# Patient Record
Sex: Male | Born: 1940 | Race: White | Hispanic: No | Smoking: Former smoker
Health system: Southern US, Community
[De-identification: ages and names within clinical notes are randomized; demographics above are authoritative.]

## PROBLEM LIST (undated history)

## (undated) DIAGNOSIS — C189 Malignant neoplasm of colon, unspecified: Secondary | ICD-10-CM

## (undated) DIAGNOSIS — I10 Essential (primary) hypertension: Secondary | ICD-10-CM

## (undated) DIAGNOSIS — E785 Hyperlipidemia, unspecified: Secondary | ICD-10-CM

## (undated) DIAGNOSIS — T7840XA Allergy, unspecified, initial encounter: Secondary | ICD-10-CM

## (undated) DIAGNOSIS — D649 Anemia, unspecified: Secondary | ICD-10-CM

## (undated) DIAGNOSIS — E669 Obesity, unspecified: Secondary | ICD-10-CM

## (undated) HISTORY — PX: OTHER SURGICAL HISTORY: SHX169

## (undated) HISTORY — DX: Allergy, unspecified, initial encounter: T78.40XA

---

## 2007-11-20 ENCOUNTER — Other Ambulatory Visit: Payer: Self-pay

## 2007-11-20 ENCOUNTER — Inpatient Hospital Stay: Payer: Self-pay | Admitting: Surgery

## 2007-11-23 ENCOUNTER — Ambulatory Visit: Payer: Self-pay | Admitting: Internal Medicine

## 2007-12-08 ENCOUNTER — Ambulatory Visit: Payer: Self-pay | Admitting: Family

## 2007-12-15 ENCOUNTER — Ambulatory Visit: Payer: Self-pay | Admitting: Internal Medicine

## 2007-12-22 ENCOUNTER — Ambulatory Visit: Payer: Self-pay | Admitting: Internal Medicine

## 2007-12-23 ENCOUNTER — Ambulatory Visit: Payer: Self-pay | Admitting: Internal Medicine

## 2007-12-29 ENCOUNTER — Ambulatory Visit: Payer: Self-pay | Admitting: Surgery

## 2007-12-30 ENCOUNTER — Ambulatory Visit: Payer: Self-pay | Admitting: Surgery

## 2008-01-03 ENCOUNTER — Ambulatory Visit: Payer: Self-pay | Admitting: Family

## 2008-01-10 ENCOUNTER — Ambulatory Visit: Payer: Self-pay | Admitting: Internal Medicine

## 2008-01-23 ENCOUNTER — Ambulatory Visit: Payer: Self-pay | Admitting: Internal Medicine

## 2008-02-22 ENCOUNTER — Ambulatory Visit: Payer: Self-pay | Admitting: Internal Medicine

## 2008-03-24 ENCOUNTER — Ambulatory Visit: Payer: Self-pay | Admitting: Internal Medicine

## 2008-04-24 ENCOUNTER — Ambulatory Visit: Payer: Self-pay | Admitting: Internal Medicine

## 2008-05-24 ENCOUNTER — Ambulatory Visit: Payer: Self-pay | Admitting: Internal Medicine

## 2008-06-24 ENCOUNTER — Ambulatory Visit: Payer: Self-pay | Admitting: Internal Medicine

## 2008-07-24 ENCOUNTER — Ambulatory Visit: Payer: Self-pay | Admitting: Internal Medicine

## 2008-08-24 ENCOUNTER — Ambulatory Visit: Payer: Self-pay | Admitting: Internal Medicine

## 2008-09-24 ENCOUNTER — Ambulatory Visit: Payer: Self-pay | Admitting: Internal Medicine

## 2008-10-22 ENCOUNTER — Ambulatory Visit: Payer: Self-pay | Admitting: Internal Medicine

## 2008-11-22 ENCOUNTER — Ambulatory Visit: Payer: Self-pay | Admitting: Internal Medicine

## 2008-11-29 ENCOUNTER — Ambulatory Visit: Payer: Self-pay | Admitting: Internal Medicine

## 2008-12-22 ENCOUNTER — Ambulatory Visit: Payer: Self-pay | Admitting: Internal Medicine

## 2009-01-22 ENCOUNTER — Ambulatory Visit: Payer: Self-pay | Admitting: Internal Medicine

## 2009-02-21 ENCOUNTER — Ambulatory Visit: Payer: Self-pay | Admitting: Internal Medicine

## 2009-03-08 ENCOUNTER — Ambulatory Visit: Payer: Self-pay | Admitting: Internal Medicine

## 2009-03-24 ENCOUNTER — Ambulatory Visit: Payer: Self-pay | Admitting: Internal Medicine

## 2009-06-24 ENCOUNTER — Ambulatory Visit: Payer: Self-pay | Admitting: Internal Medicine

## 2009-06-28 ENCOUNTER — Ambulatory Visit: Payer: Self-pay | Admitting: Internal Medicine

## 2009-07-24 ENCOUNTER — Ambulatory Visit: Payer: Self-pay | Admitting: Internal Medicine

## 2009-10-22 ENCOUNTER — Ambulatory Visit: Payer: Self-pay | Admitting: Internal Medicine

## 2009-10-25 ENCOUNTER — Ambulatory Visit: Payer: Self-pay | Admitting: Internal Medicine

## 2009-11-22 ENCOUNTER — Ambulatory Visit: Payer: Self-pay | Admitting: Internal Medicine

## 2009-12-22 ENCOUNTER — Ambulatory Visit: Payer: Self-pay | Admitting: Internal Medicine

## 2010-05-24 ENCOUNTER — Ambulatory Visit: Payer: Self-pay | Admitting: Internal Medicine

## 2010-06-04 ENCOUNTER — Ambulatory Visit: Payer: Self-pay | Admitting: Internal Medicine

## 2010-06-06 LAB — CEA: CEA: 2.3 ng/mL (ref 0.0–4.7)

## 2010-06-24 ENCOUNTER — Ambulatory Visit: Payer: Self-pay | Admitting: Internal Medicine

## 2010-12-09 ENCOUNTER — Ambulatory Visit: Payer: Self-pay | Admitting: Internal Medicine

## 2010-12-23 ENCOUNTER — Ambulatory Visit: Payer: Self-pay | Admitting: Internal Medicine

## 2011-06-10 ENCOUNTER — Ambulatory Visit: Payer: Self-pay | Admitting: Internal Medicine

## 2011-06-11 LAB — CEA: CEA: 2.5 ng/mL (ref 0.0–4.7)

## 2011-06-25 ENCOUNTER — Ambulatory Visit: Payer: Self-pay | Admitting: Internal Medicine

## 2011-12-03 ENCOUNTER — Ambulatory Visit: Payer: Self-pay | Admitting: Oncology

## 2011-12-03 LAB — CBC CANCER CENTER
Basophil %: 0.4 %
Eosinophil #: 0.2 x10 3/mm (ref 0.0–0.7)
Eosinophil %: 2.1 %
HCT: 42.8 % (ref 40.0–52.0)
Lymphocyte #: 2.1 x10 3/mm (ref 1.0–3.6)
Lymphocyte %: 23.9 %
MCHC: 34.4 g/dL (ref 32.0–36.0)
MCV: 91 fL (ref 80–100)
Neutrophil #: 5.6 x10 3/mm (ref 1.4–6.5)
Neutrophil %: 65.1 %
RBC: 4.72 10*6/uL (ref 4.40–5.90)
RDW: 13 % (ref 11.5–14.5)

## 2011-12-03 LAB — COMPREHENSIVE METABOLIC PANEL
Alkaline Phosphatase: 73 U/L (ref 50–136)
Anion Gap: 11 (ref 7–16)
Bilirubin,Total: 0.3 mg/dL (ref 0.2–1.0)
Calcium, Total: 8.9 mg/dL (ref 8.5–10.1)
Chloride: 100 mmol/L (ref 98–107)
Creatinine: 1.22 mg/dL (ref 0.60–1.30)
Osmolality: 276 (ref 275–301)
SGOT(AST): 30 U/L (ref 15–37)
SGPT (ALT): 44 U/L
Sodium: 136 mmol/L (ref 136–145)
Total Protein: 8.4 g/dL — ABNORMAL HIGH (ref 6.4–8.2)

## 2011-12-04 LAB — CEA: CEA: 2.6 ng/mL (ref 0.0–4.7)

## 2011-12-23 ENCOUNTER — Ambulatory Visit: Payer: Self-pay | Admitting: Oncology

## 2012-01-23 ENCOUNTER — Ambulatory Visit: Payer: Self-pay | Admitting: Oncology

## 2012-07-18 ENCOUNTER — Ambulatory Visit: Payer: Self-pay | Admitting: Oncology

## 2012-07-18 LAB — CBC CANCER CENTER
Basophil #: 0 x10 3/mm (ref 0.0–0.1)
Basophil %: 0.3 %
Eosinophil #: 0.2 x10 3/mm (ref 0.0–0.7)
HCT: 42.4 % (ref 40.0–52.0)
HGB: 14.3 g/dL (ref 13.0–18.0)
Lymphocyte %: 19.8 %
MCH: 31.4 pg (ref 26.0–34.0)
MCHC: 33.8 g/dL (ref 32.0–36.0)
Monocyte #: 0.7 x10 3/mm (ref 0.2–1.0)
Monocyte %: 8.3 %
Neutrophil #: 6.1 x10 3/mm (ref 1.4–6.5)
Neutrophil %: 69 %
Platelet: 280 x10 3/mm (ref 150–440)
RDW: 13.3 % (ref 11.5–14.5)
WBC: 8.9 x10 3/mm (ref 3.8–10.6)

## 2012-07-18 LAB — COMPREHENSIVE METABOLIC PANEL
Albumin: 3.8 g/dL (ref 3.4–5.0)
Alkaline Phosphatase: 63 U/L (ref 50–136)
Bilirubin,Total: 0.3 mg/dL (ref 0.2–1.0)
Calcium, Total: 9.5 mg/dL (ref 8.5–10.1)
Chloride: 100 mmol/L (ref 98–107)
Co2: 26 mmol/L (ref 21–32)
Creatinine: 1.12 mg/dL (ref 0.60–1.30)
EGFR (African American): >60
EGFR (Non-African Amer.): >60
SGOT(AST): 31 U/L (ref 15–37)
SGPT (ALT): 43 U/L (ref 12–78)

## 2012-07-19 LAB — CEA: CEA: 2.5 ng/mL (ref 0.0–4.7)

## 2012-07-24 ENCOUNTER — Ambulatory Visit: Payer: Self-pay | Admitting: Oncology

## 2012-11-21 ENCOUNTER — Inpatient Hospital Stay: Payer: Self-pay | Admitting: Internal Medicine

## 2012-11-21 LAB — CBC
HGB: 7.2 g/dL — ABNORMAL LOW (ref 13.0–18.0)
Platelet: 386 10*3/uL (ref 150–440)
RBC: 3.2 10*6/uL — ABNORMAL LOW (ref 4.40–5.90)

## 2012-11-21 LAB — BASIC METABOLIC PANEL
Anion Gap: 8 (ref 7–16)
Calcium, Total: 8.4 mg/dL — ABNORMAL LOW (ref 8.5–10.1)
Chloride: 102 mmol/L (ref 98–107)
Co2: 24 mmol/L (ref 21–32)
Creatinine: 0.96 mg/dL (ref 0.60–1.30)
EGFR (Non-African Amer.): >60
Glucose: 111 mg/dL — ABNORMAL HIGH (ref 65–99)
Osmolality: 268 (ref 275–301)
Potassium: 3.8 mmol/L (ref 3.5–5.1)
Sodium: 134 mmol/L — ABNORMAL LOW (ref 136–145)

## 2012-11-21 LAB — CK TOTAL AND CKMB (NOT AT ARMC): CK, Total: 90 U/L (ref 35–232)

## 2012-11-21 LAB — PROTIME-INR
INR: 1
Prothrombin Time: 13.4 secs (ref 11.5–14.7)

## 2012-11-21 LAB — TROPONIN I: Troponin-I: 0.03 ng/mL

## 2012-11-22 ENCOUNTER — Ambulatory Visit: Payer: Self-pay | Admitting: Oncology

## 2012-11-22 LAB — BASIC METABOLIC PANEL
Anion Gap: 9 (ref 7–16)
BUN: 10 mg/dL (ref 7–18)
Calcium, Total: 8.5 mg/dL (ref 8.5–10.1)
Co2: 24 mmol/L (ref 21–32)
Creatinine: 0.98 mg/dL (ref 0.60–1.30)
EGFR (African American): >60
Glucose: 104 mg/dL — ABNORMAL HIGH (ref 65–99)
Osmolality: 279 (ref 275–301)
Potassium: 3.8 mmol/L (ref 3.5–5.1)
Sodium: 140 mmol/L (ref 136–145)

## 2012-11-22 LAB — CBC WITH DIFFERENTIAL/PLATELET
Basophil #: 0 10*3/uL (ref 0.0–0.1)
Eosinophil #: 0.2 10*3/uL (ref 0.0–0.7)
Eosinophil %: 2.2 %
HCT: 26.8 % — ABNORMAL LOW (ref 40.0–52.0)
HGB: 8.6 g/dL — ABNORMAL LOW (ref 13.0–18.0)
Lymphocyte %: 15.5 %
MCV: 75 fL — ABNORMAL LOW (ref 80–100)
Monocyte #: 0.9 x10 3/mm (ref 0.2–1.0)
Monocyte %: 9.2 %
Platelet: 361 10*3/uL (ref 150–440)
RBC: 3.58 10*6/uL — ABNORMAL LOW (ref 4.40–5.90)
RDW: 19.9 % — ABNORMAL HIGH (ref 11.5–14.5)

## 2012-11-23 LAB — CEA: CEA: 1.6 ng/mL (ref 0.0–4.7)

## 2012-11-24 LAB — CBC WITH DIFFERENTIAL/PLATELET
Basophil %: 0.4 %
Eosinophil #: 0.2 10*3/uL (ref 0.0–0.7)
Eosinophil %: 2.8 %
Lymphocyte #: 1.2 10*3/uL (ref 1.0–3.6)
Lymphocyte %: 13.9 %
MCH: 23.6 pg — ABNORMAL LOW (ref 26.0–34.0)
Monocyte #: 0.7 x10 3/mm (ref 0.2–1.0)
Monocyte %: 8.2 %
Neutrophil #: 6.6 10*3/uL — ABNORMAL HIGH (ref 1.4–6.5)
Platelet: 314 10*3/uL (ref 150–440)
RDW: 20.5 % — ABNORMAL HIGH (ref 11.5–14.5)
WBC: 8.8 10*3/uL (ref 3.8–10.6)

## 2012-11-24 LAB — PATHOLOGY REPORT

## 2012-11-25 LAB — CBC WITH DIFFERENTIAL/PLATELET
Basophil %: 0.3 %
Eosinophil #: 0.2 10*3/uL (ref 0.0–0.7)
HCT: 26.3 % — ABNORMAL LOW (ref 40.0–52.0)
HGB: 8.2 g/dL — ABNORMAL LOW (ref 13.0–18.0)
Lymphocyte #: 1.4 10*3/uL (ref 1.0–3.6)
Lymphocyte %: 17.2 %
MCH: 23.4 pg — ABNORMAL LOW (ref 26.0–34.0)
MCV: 75 fL — ABNORMAL LOW (ref 80–100)
Monocyte #: 0.7 x10 3/mm (ref 0.2–1.0)
Monocyte %: 8.4 %
Neutrophil #: 5.7 10*3/uL (ref 1.4–6.5)
Neutrophil %: 71.2 %
Platelet: 325 10*3/uL (ref 150–440)

## 2012-11-25 LAB — COMPREHENSIVE METABOLIC PANEL
Albumin: 3.4 g/dL (ref 3.4–5.0)
Alkaline Phosphatase: 58 U/L (ref 50–136)
Anion Gap: 8 (ref 7–16)
Bilirubin,Total: 0.3 mg/dL (ref 0.2–1.0)
Calcium, Total: 8.5 mg/dL (ref 8.5–10.1)
Chloride: 110 mmol/L — ABNORMAL HIGH (ref 98–107)
Co2: 24 mmol/L (ref 21–32)
EGFR (African American): >60
Glucose: 90 mg/dL (ref 65–99)
Osmolality: 282 (ref 275–301)
SGOT(AST): 29 U/L (ref 15–37)
SGPT (ALT): 24 U/L (ref 12–78)

## 2012-12-01 ENCOUNTER — Inpatient Hospital Stay: Payer: Self-pay | Admitting: Surgery

## 2012-12-01 LAB — CBC WITH DIFFERENTIAL/PLATELET
Basophil #: 0 10*3/uL (ref 0.0–0.1)
Basophil %: 0.3 %
Eosinophil #: 0.1 10*3/uL (ref 0.0–0.7)
Eosinophil %: 1.2 %
Lymphocyte #: 1.2 10*3/uL (ref 1.0–3.6)
Lymphocyte %: 12.7 %
MCHC: 30.7 g/dL — ABNORMAL LOW (ref 32.0–36.0)
MCV: 76 fL — ABNORMAL LOW (ref 80–100)
Monocyte #: 0.8 x10 3/mm (ref 0.2–1.0)
Monocyte %: 8.3 %
Platelet: 411 10*3/uL (ref 150–440)
RBC: 4.27 10*6/uL — ABNORMAL LOW (ref 4.40–5.90)
RDW: 22.8 % — ABNORMAL HIGH (ref 11.5–14.5)

## 2012-12-01 LAB — BASIC METABOLIC PANEL
Anion Gap: 5 — ABNORMAL LOW (ref 7–16)
Chloride: 105 mmol/L (ref 98–107)
Co2: 26 mmol/L (ref 21–32)
EGFR (Non-African Amer.): >60
Glucose: 110 mg/dL — ABNORMAL HIGH (ref 65–99)
Potassium: 4 mmol/L (ref 3.5–5.1)
Sodium: 136 mmol/L (ref 136–145)

## 2012-12-02 LAB — BASIC METABOLIC PANEL
Calcium, Total: 8.6 mg/dL (ref 8.5–10.1)
Creatinine: 0.99 mg/dL (ref 0.60–1.30)
EGFR (African American): >60
EGFR (Non-African Amer.): >60
Osmolality: 275 (ref 275–301)
Potassium: 4.3 mmol/L (ref 3.5–5.1)
Sodium: 137 mmol/L (ref 136–145)

## 2012-12-02 LAB — CBC WITH DIFFERENTIAL/PLATELET
Basophil #: 0 10*3/uL (ref 0.0–0.1)
Eosinophil #: 0 10*3/uL (ref 0.0–0.7)
Eosinophil %: 0 %
HCT: 28.1 % — ABNORMAL LOW (ref 40.0–52.0)
MCHC: 30.4 g/dL — ABNORMAL LOW (ref 32.0–36.0)
MCV: 77 fL — ABNORMAL LOW (ref 80–100)
Monocyte #: 1 x10 3/mm (ref 0.2–1.0)
Monocyte %: 7.5 %
Neutrophil #: 12 10*3/uL — ABNORMAL HIGH (ref 1.4–6.5)
Platelet: 368 10*3/uL (ref 150–440)
RBC: 3.65 10*6/uL — ABNORMAL LOW (ref 4.40–5.90)
RDW: 21.9 % — ABNORMAL HIGH (ref 11.5–14.5)
WBC: 13.8 10*3/uL — ABNORMAL HIGH (ref 3.8–10.6)

## 2012-12-03 LAB — COMPREHENSIVE METABOLIC PANEL
Albumin: 2.9 g/dL — ABNORMAL LOW (ref 3.4–5.0)
Anion Gap: 5 — ABNORMAL LOW (ref 7–16)
Bilirubin,Total: 0.5 mg/dL (ref 0.2–1.0)
Co2: 26 mmol/L (ref 21–32)
EGFR (African American): >60
EGFR (Non-African Amer.): >60
Osmolality: 267 (ref 275–301)
Potassium: 3.9 mmol/L (ref 3.5–5.1)
SGOT(AST): 17 U/L (ref 15–37)
SGPT (ALT): 16 U/L (ref 12–78)
Sodium: 134 mmol/L — ABNORMAL LOW (ref 136–145)
Total Protein: 7.1 g/dL (ref 6.4–8.2)

## 2012-12-03 LAB — CBC WITH DIFFERENTIAL/PLATELET
Basophil #: 0 10*3/uL (ref 0.0–0.1)
Eosinophil #: 0 10*3/uL (ref 0.0–0.7)
HCT: 27 % — ABNORMAL LOW (ref 40.0–52.0)
HGB: 8.1 g/dL — ABNORMAL LOW (ref 13.0–18.0)
Lymphocyte #: 1 10*3/uL (ref 1.0–3.6)
Lymphocyte %: 6.7 %
MCH: 22.8 pg — ABNORMAL LOW (ref 26.0–34.0)
MCV: 76 fL — ABNORMAL LOW (ref 80–100)
Neutrophil #: 12.2 10*3/uL — ABNORMAL HIGH (ref 1.4–6.5)
Neutrophil %: 85.8 %
Platelet: 343 10*3/uL (ref 150–440)
RDW: 22.3 % — ABNORMAL HIGH (ref 11.5–14.5)
WBC: 14.2 10*3/uL — ABNORMAL HIGH (ref 3.8–10.6)

## 2012-12-06 LAB — PATHOLOGY REPORT

## 2012-12-16 ENCOUNTER — Ambulatory Visit: Payer: Self-pay | Admitting: Oncology

## 2012-12-22 ENCOUNTER — Ambulatory Visit: Payer: Self-pay | Admitting: Oncology

## 2012-12-29 ENCOUNTER — Ambulatory Visit: Payer: Self-pay | Admitting: Oncology

## 2012-12-30 LAB — CBC CANCER CENTER
Basophil #: 0 x10 3/mm (ref 0.0–0.1)
Basophil %: 0.4 %
Eosinophil %: 1.8 %
HGB: 10.5 g/dL — ABNORMAL LOW (ref 13.0–18.0)
Lymphocyte #: 1.6 x10 3/mm (ref 1.0–3.6)
MCH: 23.4 pg — ABNORMAL LOW (ref 26.0–34.0)
MCHC: 31.8 g/dL — ABNORMAL LOW (ref 32.0–36.0)
Monocyte #: 0.7 x10 3/mm (ref 0.2–1.0)
Monocyte %: 7.9 %
Neutrophil #: 6.3 x10 3/mm (ref 1.4–6.5)
Neutrophil %: 71.9 %
WBC: 8.8 x10 3/mm (ref 3.8–10.6)

## 2012-12-30 LAB — FERRITIN: Ferritin (ARMC): 15 ng/mL (ref 8–388)

## 2012-12-30 LAB — IRON AND TIBC: Iron: 80 ug/dL (ref 65–175)

## 2012-12-31 LAB — CEA: CEA: 1.8 ng/mL (ref 0.0–4.7)

## 2013-01-22 ENCOUNTER — Ambulatory Visit: Payer: Self-pay | Admitting: Oncology

## 2013-04-06 ENCOUNTER — Ambulatory Visit: Payer: Self-pay | Admitting: Oncology

## 2013-05-24 ENCOUNTER — Ambulatory Visit: Payer: Self-pay | Admitting: Internal Medicine

## 2013-06-15 ENCOUNTER — Emergency Department: Payer: Self-pay | Admitting: Internal Medicine

## 2013-06-15 LAB — COMPREHENSIVE METABOLIC PANEL
Albumin: 4 g/dL (ref 3.4–5.0)
Anion Gap: 6 — ABNORMAL LOW (ref 7–16)
BUN: 10 mg/dL (ref 7–18)
Bilirubin,Total: 0.3 mg/dL (ref 0.2–1.0)
Co2: 26 mmol/L (ref 21–32)
EGFR (African American): >60
Glucose: 118 mg/dL — ABNORMAL HIGH (ref 65–99)
Potassium: 4.2 mmol/L (ref 3.5–5.1)
SGOT(AST): 28 U/L (ref 15–37)
SGPT (ALT): 37 U/L (ref 12–78)

## 2013-06-15 LAB — CBC
HCT: 44.1 % (ref 40.0–52.0)
HGB: 15.5 g/dL (ref 13.0–18.0)
MCH: 31.3 pg (ref 26.0–34.0)
MCV: 89 fL (ref 80–100)
WBC: 10.5 10*3/uL (ref 3.8–10.6)

## 2013-08-18 ENCOUNTER — Emergency Department: Payer: Self-pay | Admitting: Emergency Medicine

## 2013-08-18 LAB — COMPREHENSIVE METABOLIC PANEL
Albumin: 3.7 g/dL (ref 3.4–5.0)
Alkaline Phosphatase: 66 U/L
Anion Gap: 7 (ref 7–16)
BUN: 13 mg/dL (ref 7–18)
Bilirubin,Total: 0.3 mg/dL (ref 0.2–1.0)
Calcium, Total: 9 mg/dL (ref 8.5–10.1)
Chloride: 103 mmol/L (ref 98–107)
Co2: 23 mmol/L (ref 21–32)
Creatinine: 0.98 mg/dL (ref 0.60–1.30)
EGFR (African American): >60
EGFR (Non-African Amer.): >60
Glucose: 104 mg/dL — ABNORMAL HIGH (ref 65–99)
Osmolality: 267 (ref 275–301)
Potassium: 3.9 mmol/L (ref 3.5–5.1)
SGOT(AST): 38 U/L — ABNORMAL HIGH (ref 15–37)
SGPT (ALT): 41 U/L (ref 12–78)
Sodium: 133 mmol/L — ABNORMAL LOW (ref 136–145)
Total Protein: 7.5 g/dL (ref 6.4–8.2)

## 2013-08-18 LAB — URINALYSIS, COMPLETE
Bilirubin,UR: NEGATIVE
Glucose,UR: NEGATIVE mg/dL (ref 0–75)
Ketone: NEGATIVE
Ph: 5 (ref 4.5–8.0)
Specific Gravity: 1.005 (ref 1.003–1.030)
Squamous Epithelial: <1

## 2013-08-18 LAB — CBC WITH DIFFERENTIAL/PLATELET
Basophil #: 0 10*3/uL (ref 0.0–0.1)
Basophil %: 0.5 %
Eosinophil #: 0.1 10*3/uL (ref 0.0–0.7)
Eosinophil %: 1.6 %
HCT: 41.4 % (ref 40.0–52.0)
HGB: 14.3 g/dL (ref 13.0–18.0)
Lymphocyte #: 1.6 10*3/uL (ref 1.0–3.6)
Lymphocyte %: 18 %
MCH: 31.5 pg (ref 26.0–34.0)
MCHC: 34.4 g/dL (ref 32.0–36.0)
MCV: 91 fL (ref 80–100)
Monocyte #: 0.9 x10 3/mm (ref 0.2–1.0)
Monocyte %: 9.9 %
Neutrophil #: 6.4 10*3/uL (ref 1.4–6.5)
Neutrophil %: 70 %
Platelet: 244 10*3/uL (ref 150–440)
RBC: 4.53 10*6/uL (ref 4.40–5.90)
RDW: 13.2 % (ref 11.5–14.5)
WBC: 9.1 10*3/uL (ref 3.8–10.6)

## 2013-08-18 LAB — TROPONIN I: Troponin-I: <0.02 ng/mL

## 2013-09-08 ENCOUNTER — Emergency Department: Payer: Self-pay | Admitting: Emergency Medicine

## 2013-09-08 LAB — URINALYSIS, COMPLETE
Bacteria: NONE SEEN
Bilirubin,UR: NEGATIVE
GLUCOSE, UR: NEGATIVE mg/dL (ref 0–75)
Hyaline Cast: 9
KETONE: NEGATIVE
Nitrite: NEGATIVE
PROTEIN: NEGATIVE
Ph: 6 (ref 4.5–8.0)
Specific Gravity: 1.01 (ref 1.003–1.030)
Squamous Epithelial: NONE SEEN
WBC UR: 5 /HPF (ref 0–5)

## 2013-09-08 LAB — BASIC METABOLIC PANEL
Anion Gap: 6 — ABNORMAL LOW (ref 7–16)
BUN: 9 mg/dL (ref 7–18)
CALCIUM: 9.9 mg/dL (ref 8.5–10.1)
CO2: 27 mmol/L (ref 21–32)
Chloride: 103 mmol/L (ref 98–107)
Creatinine: 0.89 mg/dL (ref 0.60–1.30)
EGFR (African American): >60
GLUCOSE: 111 mg/dL — AB (ref 65–99)
Osmolality: 271 (ref 275–301)
Potassium: 4 mmol/L (ref 3.5–5.1)
Sodium: 136 mmol/L (ref 136–145)

## 2014-01-15 ENCOUNTER — Ambulatory Visit: Payer: Self-pay

## 2014-01-24 ENCOUNTER — Ambulatory Visit: Payer: Self-pay | Admitting: Otolaryngology

## 2014-02-06 ENCOUNTER — Ambulatory Visit: Payer: Self-pay | Admitting: Vascular Surgery

## 2014-12-14 NOTE — H&P (Signed)
PATIENT NAME:  David Merritt, David Merritt MR#:  [id number] DATE OF BIRTH:  [birth year]  DATE OF ADMISSION:  12/01/2012  CHIEF COMPLAINT: Colon cancer.   HISTORY OF PRESENT ILLNESS: This is a patient with a prior history of colon cancer with a right hemicolectomy performed by Dr. Michela PitcherEly in 2009. He presented recently with GI bleed and a work-up showed multiple polyps and a frank adenocarcinoma in the proximal transverse colon near, but not associated with, the prior anastomosis. He also required a 2 unit transfusion at that time. He is here for elective colon resection, possible subtotal colectomy.   PAST MEDICAL HISTORY: Colon cancer, hypertension and possible congestive heart failure. Of note, he has been cleared by cardiology during his last hospitalization.   MEDICATIONS: Lisinopril and metoprolol.   FAMILY HISTORY: Noncontributory.   PAST SURGICAL HISTORY: Right hemicolectomy.   ALLERGIES: CONTRAST DYE.   SOCIAL HISTORY: The patient does not smoke. He drinks alcohol.   REVIEW OF SYSTEMS: A 10 system review has been performed and negative with the exception of that mentioned in the HPI and documented in prior charts.   PHYSICAL EXAMINATION:  GENERAL: Healthy, obese male patient.  HEENT: No scleral icterus.  NECK: No palpable neck nodes.  CHEST: Clear to auscultation.  CARDIAC: Regular rate and rhythm.  ABDOMEN: Soft, nontender. Laparoscopy scars are noted.  EXTREMITIES: Show minimal edema.  NEUROLOGIC: Grossly intact.  INTEGUMENT: Shows no jaundice.   LABORATORY, DIAGNOSTIC AND RADIOLOGICAL DATA: Recorded in the chart. Pathology report is reviewed. He has a frank adenocarcinoma at the proximal transverse colon, multiple small adenomatous polyps and an adenomatous polyp with dysplasia that was removed at 26 cm.   ASSESSMENT AND PLAN: This is a patient with multiple polyps, one of which has dysplasia at 26 cm and has been removed. The others are frank adenocarcinoma near, but not associated  with, prior ileocolonic anastomosis. The patient and I have discussed the rationale for offering surgery and the2 types of surgery that would be offered, which would be local limited colon resection versus subtotal colectomy. The risks of bleeding, infection, recurrence, anastomosis, anastomotic leak and temporary or permanent ostomy were all reviewed with him. He will choose which operation he prefers and that will all be reviewed with him again in the preop holding area. We are favoring a limited resection in this patient with multiple medical problems.  ____________________________ Adah Salvageichard Merritt. Excell Seltzerooper, MD rec:aw D: 12/01/2012 10:32:50 ET T: 12/01/2012 10:41:18 ET JOB#: [id number]  cc: Adah Salvageichard Merritt. Excell Seltzerooper, MD, <Dictator> Lattie HawICHARD Merritt [place] MD ELECTRONICALLY SIGNED 12/01/2012 11:55

## 2014-12-14 NOTE — Consult Note (Signed)
History of Present Illness:  Reason for Consult History of colon cancer, now with GI bleed.   HPI   Patient last evaluated in clinic in November 2013.  David Merritt presented to the emergency room complaining of lightheadedness and shortness of breath.  David Merritt was found to have blood in his stool and his hemoglobin has dropped to 7.2.  Patient does report a history of "bleeding hemorrhoids" over the past several weeks.  David Merritt denies any melena.  Currently David Merritt feels well and is nearly back to his baseline after receiving several units of packed red blood cells.  David Merritt has had no recent fevers or illnesses.  David Merritt has no neurologic complaints.  David Merritt has a good appetite and denies weight loss.  David Merritt has no urinary complaints.  Patient offers no further specific complaints today.  PFSH:  Additional Past Medical and Surgical History Past medical history: Hypertension.  Past surgical history: Right colectomy.   Social history: Positive tobacco x40 years, none for greater than 9 years.  Previous heavy alcohol, now occasional.  Family history: Diabetes, hypertension.   Review of Systems:  Performance Status (ECOG) 0   Review of Systems   As per HPI. Otherwise, 10 point system review was negative.   NURSING NOTES: **Vital Signs.:   01-Apr-14 09:02   Temperature Temperature (F): 98.1   Celsius: 36.7   Temperature Source: oral   Pulse Pulse: 85   Respirations Respirations: 18   Systolic BP Systolic BP: 672   Diastolic BP (mmHg) Diastolic BP (mmHg): 68   Mean BP: 90   Pulse Ox % Pulse Ox %: 96   Pulse Ox Activity Level: At rest   Oxygen Delivery: Room Air/ 21 %   Physical Exam:  Physical Exam General: Well-developed, well-nourished, no acute distress. Eyes: Pink conjunctiva, anicteric sclera. HEENT: Normocephalic, moist mucous membranes, clear oropharnyx. Lungs: Clear to auscultation bilaterally. Heart: Regular rate and rhythm. No rubs, murmurs, or gallops. Abdomen: Soft, nontender, nondistended.  No organomegaly noted, normoactive bowel sounds. Musculoskeletal: No edema, cyanosis, or clubbing. Neuro: Alert, answering all questions appropriately. Cranial nerves grossly intact. Skin: No rashes or petechiae noted. Psych: Normal affect. Lymphatics: No cervical, calvicular, axillary or inguinal LAD.    Contrast dye: Weakness, Muscles aches, Dizzy/Fainting    lisinopril tablet 10 mg: 1 tab(s) orally once a day, Status: Active, Quantity: 30, Refills: 5   metoprolol 25 mg oral tablet: 0.5 tab(s) orally once a day, Status: Active, Quantity: 30, Refills: 5  Laboratory Results: Routine Chem:  01-Apr-14 05:28   Glucose, Serum  104  BUN 10  Creatinine (comp) 0.98  Sodium, Serum 140  Potassium, Serum 3.8  Chloride, Serum 107  CO2, Serum 24  Calcium (Total), Serum 8.5  Anion Gap 9  Osmolality (calc) 279  eGFR (African American) >60  eGFR (Non-African American) >60 (eGFR values <8m/min/1.73 m2 may be an indication of chronic kidney disease (CKD). Calculated eGFR is useful in patients with stable renal function. The eGFR calculation will not be reliable in acutely ill patients when serum creatinine is changing rapidly. It is not useful in  patients on dialysis. The eGFR calculation may not be applicable to patients at the low and high extremes of body sizes, pregnant women, and vegetarians.)  Routine Hem:  01-Apr-14 05:28   WBC (CBC) 9.9  RBC (CBC)  3.58  Hemoglobin (CBC)  8.6  Hematocrit (CBC)  26.8  Platelet Count (CBC) 361  MCV  75  MCH  24.1  MCHC 32.2  RDW  19.9  Neutrophil % 72.7  Lymphocyte % 15.5  Monocyte % 9.2  Eosinophil % 2.2  Basophil % 0.4  Neutrophil #  7.2  Lymphocyte # 1.5  Monocyte # 0.9  Eosinophil # 0.2  Basophil # 0.0 (Result(s) reported on 22 Nov 2012 at 05:49AM.)   Radiology Results: CT:    31-Mar-14 21:50, CT Abdomen and Pelvis Without Contrast  CT Abdomen and Pelvis Without Contrast   REASON FOR EXAM:    (1) gi bleed. H/O colon cancer.  Allergy to dye; (2)   gi bleed. H/O colon cancer  COMMENTS:       PROCEDURE: CT  - CT ABDOMEN AND PELVIS W0  - Nov 21 2012  9:50PM     RESULT: Indication: GI bleed    Comparison: 12/03/2009    Technique: Multiple axial images from the lung bases to the symphysis   pubis were obtained without oral and without intravenous contrast.    Findings:  The lung bases are clear. There is no pleural or pericardial effusions.    No renal, ureteral, or bladder calculi. No obstructive uropathy. No   perinephric stranding is seen. The kidneys are symmetric in size without   evidence for exophytic mass. The bladder is unremarkable.    The liver demonstrates no focal abnormality. The gallbladder is   unremarkable. The spleen demonstrates no focal abnormality. The adrenal   glands and pancreas are normal.     There is a small fat-containing umbilical hernia. The unopacified   stomach, duodenum, small intestine, and large intestine are unremarkable,   but evaluation is limited by lack of oral contrast.  There is no   pneumoperitoneum, pneumatosis, or portal venous gas. There is no   abdominal or pelvic free fluid. There is no lymphadenopathy.   The abdominal aorta is normal in caliber with atherosclerosis.    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    IMPRESSION:     1. No acute abdominal or pelvic pathology.    Dictation Site: 1        Verified By: Jennette Banker, M.D., MD   Assessment and Plan: Impression:   Stage IIIc adenocarcinoma of ascending colon.  Plan:   1.  GI bleed: Possibly a recurrence of his cancer, but unlikely.  Will get a CEA today.  Patient also has a GI consult pending for likely colonoscopy. Anemia: Secondary to GI bleed.  Patient symptomatically improved with transfusion.  David Merritt does not require any further transfusions at this time.  Continue to monitor daily hemoglobin and transfuse if patient's hemoglobin falls below 7.0. consult, will follow.   Electronic  Signatures: Delight Hoh (MD)  (Signed 01-Apr-14 13:59)  Authored: HISTORY OF PRESENT ILLNESS, PFSH, ROS, NURSING NOTES, PE, ALLERGIES, HOME MEDICATIONS, LABS, OTHER RESULTS, ASSESSMENT AND PLAN   Last Updated: 01-Apr-14 13:59 by Delight Hoh (MD)

## 2014-12-14 NOTE — Consult Note (Signed)
Brief Consult Note: Diagnosis: Pre-op. no prior h/o MI, CVA, DM or CKD. Patient reports h/o CHF following transfusion of 5u of blood, otherwise negative.   Patient was seen by consultant.   Consult note dictated.   Comments: REC  Proceed with surgery, no further cardiac diagnostics at this time.  Electronic Signatures: Marcina MillardParaschos, Conner Muegge (MD)  (Signed 03-Apr-14 19:54)  Authored: Brief Consult Note   Last Updated: 03-Apr-14 19:54 by Marcina MillardParaschos, Sharon Stapel (MD)

## 2014-12-14 NOTE — Consult Note (Signed)
Brief Consult Note: Diagnosis: Symtomatic anemia.   Consult note dictated.   Discussed with Attending MD.   Comments: patient seen and evaluated. Anemia with hx of BRBPR over the past few weeks. Personal hx of colon CA s/p partial colectomy in 2009 with adjuvant chemotherapy. Has not had a colonoscopy since this surgery. CT reviewed and unremarkable, however eval of bowel is limited without contrast. Agree with serial H&H, transfuse as needed. Avoid blood thinners if possible. Will discuss with Dr Ricki RodriguezSkulski regarding necessity for inpatient endoscopic intervention. will follow.  Electronic Signatures: Brantley StageEarle, Shamond Skelton M (PA-C)  (Signed 01-Apr-14 12:53)  Authored: Brief Consult Note   Last Updated: 01-Apr-14 12:53 by Ashok CordiaEarle, Eshaal Duby M (PA-C)

## 2014-12-14 NOTE — Discharge Summary (Signed)
DATE OF BIRTH:  09-18-1940  DATE OF ADMISSION:  11/21/2012  ADMITTING PHYSICIAN:  Dr. Darvin Neighbours  DATE OF DISCHARGE:  11/25/2012  DISCHARGING PHYSICIAN:  Dr. Gladstone Lighter   PRIMARY ONCOLOGIST:  Dr. Lattie Corns IN THE HOSPITAL:  1.  GI consultation by Dr. Gustavo Lah. 2.  Surgical consultation by Dr. Burt Knack. 3.  Oncology consultation by Dr. Grayland Ormond.   DISCHARGE DIAGNOSES: 1.  Acute on chronic symptomatic iron deficiency anemia, status post 2 units packed red blood cell transfusion this admission.  2.  Acute esophagitis and gastritis.  3.  Colon cancer, with possible recurrence.  4.  Hypertension.  DISCHARGE MEDICATIONS:  1.  Lisinopril 10 mg p.o. daily.  2.  Metoprolol 12.5 mg p.o. daily.  3.  Ferrous sulfate 325 mg p.o. b.i.d.  4.  Protonix 40 mg p.o. b.i.d.   DISCHARGE DIET:  Low sodium diet.   DISCHARGE ACTIVITY:  As tolerated.    FOLLOWUP INSTRUCTIONS: 1.  Follow up with Dr. Burt Knack and Dr. Pat Patrick for surgery next week.  2.  Oncology followup with Dr. Grayland Ormond in 1 to 2 weeks.  3.  PCP followup in 2 to 3 weeks.  4.  Patient advised to hold off any aspirin products for the next couple of weeks.   LABS AND IMAGING STUDIES:  WBC 8.0, hemoglobin 8.2, hematocrit 26.3, platelet count 325.  Sodium 142, potassium 3.6, chloride 110, bicarb 24, BUN 10, creatinine 0.86, glucose 90, calcium 8.5. ALT 24, AST 29, alk phos 58, total bili 0.3, albumin 3.4.   CT of the chest, abdomen and pelvis showing stable lymph node within posterior aspect of  mediastinum, subcentimeter mediastinal lymph nodes, no evidence of recurrent active disease in chest, abdomen or pelvis. Stable nodule in the left adrenal gland is present. Pathology from the biopsy of his colonic polyps showing tubular adenoma, negative for high-grade dysplasia malignancy. There is a mass distal to anastomosis, 10 cm, showing invasive adenocarcinoma. CLL was within normal limits, though.   BRIEF HOSPITAL COURSE: The  patient is a 74 year old male with past medical history significant for hypertension, history of colon cancer about 5 years ago, status post surgical resection, and has been followed at [facility] by his CEA levels. Was doing well, in his normal state of health, except recent lightheadedness and difficulty breathing. Presented to the ER. His hemoglobin was found to be low at 7.2, and his Hemoccult was positive, so he was admitted for symptomatic anemia.   Acute on chronic symptomatic anemia, likely iron deficiency from GI bleed. He was taking aspirin-containing products as an outpatient, so he had surgery. I was consulted. He was given 2 units of packed RBC transfusion. Hemoglobin remained stable at 8.2. GI did both upper GI endoscopy, which showed severe gastritis and also esophagitis, and lower colonoscopy did show there was recurrence of the adenocarcinoma of his colon. There was a mass, 10 cm, proximal to the anastomosis, and multiple abnormalities and hyperplastic polyps throughout the colon. Surgery was consulted and they have scheduled surgery for next week. Plan is to do local resection of the mass versus subtotal colectomy because of the multiple adenomatous polyps. At this time, plan is still unclear, but patient is being discharged home, and will follow up with Dr. Burt Knack and Dr. Pat Patrick as an outpatient this Thursday. Time will be called to his home number on Monday. Plan was acceptable by patient, and it was also discussed with Dr. Gustavo Lah and Dr. Grayland Ormond prior to discharge. The patient is being  discharged on iron supplements so that he will not need transfusion prior to surgery. He was also seen by Dr. Saralyn Pilar for cardiac clearance, who cleared the patient for surgery at this time. The patient can continue to take his blood pressure medications without any changes. He was also started on Protonix b.i.d. for his esophagitis and gastritis as seen on the upper GI endoscopy. CT chest, abdomen and  pelvis did not show any other foci of metastatic disease. His course has been otherwise uneventful in the hospital.   DISCHARGE CONDITION:  Stable.   DISCHARGE DISPOSITION: Home.   Time Spent on discharge:  45 minutes.     ____________________________ Gladstone Lighter, MD rk:mr D: 11/25/2012 14:32:00 ET T: 11/25/2012 22:06:36 ET JOB#: [id number]  cc: Gladstone Lighter, MD, <Dictator> Kathlene November. Grayland Ormond, MD Micheline Maze, MD   Gladstone Lighter MD ELECTRONICALLY SIGNED 12/09/2012 15:46

## 2014-12-14 NOTE — Consult Note (Signed)
Chief Complaint:  Subjective/Chief Complaint Patient seen and examined, please see full GI consult.  Patietn admitted with symptomatic anemia and rectal bleeding.  Of note, patietn was taking probably nerar daily asa/otc nsaids for many months prior to a month ago, and evne some since then.  First saw red blood in stool about a month ago.  Denies black stools or nausea vomiting or abdominal pain.  At the time of his colon cancer diagnosis in 2009, he also had Peptic ulcers.  He has completed over half of the golytely solution in preparation for his colonoscopy tomorrow.  Will add EGD as well.  On rectal exam after the prep as above he was heme negative and he did not see andy blood in the prepped stool effluent.   Will allow clear liquids tonight, resume prep early in am as a split prep.  EGD and colonoscopy tomorrow pm.  I have discussed the risks benefits and complicatiosn of egd and colonoscopy to incluse not limited to bleeding infection perforation and sedation and he wishes to proceed.   Electronic Signatures: Barnetta ChapelSkulskie, Nai Dasch (MD)  (Signed 01-Apr-14 20:10)  Authored: Chief Complaint   Last Updated: 01-Apr-14 20:10 by Barnetta ChapelSkulskie, Haleemah Buckalew (MD)

## 2014-12-14 NOTE — Consult Note (Signed)
Chief Complaint:  Subjective/Chief Complaint seen for anemia and rectal bleeding.  doing well, no repeat lbeeding, tolerated prep for proceedures.  Denies n/v abdominal pain.   VITAL SIGNS/ANCILLARY NOTES: **Vital Signs.:   02-Apr-14 11:40  Vital Signs Type Q 4hr  Temperature Temperature (F) 98.1  Celsius 36.7  Temperature Source oral  Pulse Pulse 76  Respirations Respirations 14  Systolic BP Systolic BP 793  Diastolic BP (mmHg) Diastolic BP (mmHg) 67  Mean BP 92  Pulse Ox % Pulse Ox % 95  Pulse Ox Activity Level  At rest  Oxygen Delivery Room Air/ 21 %   Brief Assessment:  Cardiac Regular   Respiratory clear BS   Gastrointestinal details normal Soft  Nontender  Nondistended  No masses palpable  Bowel sounds normal   Lab Results: Oncology:  01-Apr-14 15:21   Carcinoembryonic Antigen (CEA) 1.6 (Roche ECLIA methodology       Nonsmokers  <3.9                                                     Smokers     <5.6            [facility]            No: [phone]           [address], [place], [place] [address]           Lindon Romp, MD         [phone] Result(s) reported on 23 Nov 2012 at 06:49AM.)  Routine Chem:  01-Apr-14 05:28   Glucose, Serum  104  BUN 10  Creatinine (comp) 0.98  Sodium, Serum 140  Potassium, Serum 3.8  Chloride, Serum 107  CO2, Serum 24  Calcium (Total), Serum 8.5  Anion Gap 9  Osmolality (calc) 279  eGFR (African American) >60  eGFR (Non-African American) >60 (eGFR values <23m/min/1.73 m2 may be an indication of chronic kidney disease (CKD). Calculated eGFR is useful in patients with stable renal function. The eGFR calculation will not be reliable in acutely ill patients when serum creatinine is changing rapidly. It is not useful in  patients on dialysis. The eGFR calculation may not be applicable to patients at the low and high extremes of body sizes, pregnant women, and vegetarians.)  Routine Hem:  01-Apr-14 05:28    WBC (CBC) 9.9  RBC (CBC)  3.58  Hemoglobin (CBC)  8.6  Hematocrit (CBC)  26.8  Platelet Count (CBC) 361  MCV  75  MCH  24.1  MCHC 32.2  RDW  19.9  Neutrophil % 72.7  Lymphocyte % 15.5  Monocyte % 9.2  Eosinophil % 2.2  Basophil % 0.4  Neutrophil #  7.2  Lymphocyte # 1.5  Monocyte # 0.9  Eosinophil # 0.2  Basophil # 0.0 (Result(s) reported on 22 Nov 2012 at 05:49AM.)   Radiology Results: CT:    31-Mar-14 21:50, CT Abdomen and Pelvis Without Contrast  CT Abdomen and Pelvis Without Contrast   REASON FOR EXAM:    (1) gi bleed. H/O colon cancer. Allergy to dye; (2)   gi bleed. H/O colon cancer  COMMENTS:       PROCEDURE: CT  - CT ABDOMEN AND PELVIS W0  - Nov 21 2012  9:50PM     RESULT: Indication: GI bleed    Comparison:  12/03/2009    Technique: Multiple axial images from the lung bases to the symphysis   pubis were obtained without oral and without intravenous contrast.    Findings:  The lung bases are clear. There is no pleural or pericardial effusions.    No renal, ureteral, or bladder calculi. No obstructive uropathy. No   perinephric stranding is seen. The kidneys are symmetric in size without   evidence for exophytic mass. The bladder is unremarkable.    The liver demonstrates no focal abnormality. The gallbladder is   unremarkable. The spleen demonstrates no focal abnormality. The adrenal   glands and pancreas are normal.     There is a small fat-containing umbilical hernia. The unopacified   stomach, duodenum, small intestine, and large intestine are unremarkable,   but evaluation is limited by lack of oral contrast.  There is no   pneumoperitoneum, pneumatosis, or portal venous gas. There is no   abdominal or pelvic free fluid. There is no lymphadenopathy.   The abdominal aorta is normal in caliber with atherosclerosis.    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    IMPRESSION:     1. No acute abdominal or pelvic pathology.    Dictation Site:  1        Verified By: Jennette Banker, M.D., MD   Assessment/Plan:  Assessment/Plan:  Assessment 1) rectal bleeding and anemia in the setting of frequent asa/nsaid use and personal h/o colon cancer.   Plan 1) for egd and colonoscopy today.  I have discussed the risks benefits and complications of these to include not limited to bleeding infection perforation and sedation and he sishes to proceed.  further recs to follow.   Electronic Signatures: Loistine Simas (MD)  (Signed 02-Apr-14 13:20)  Authored: Chief Complaint, VITAL SIGNS/ANCILLARY NOTES, Brief Assessment, Lab Results, Radiology Results, Assessment/Plan   Last Updated: 02-Apr-14 13:20 by Loistine Simas (MD)

## 2014-12-14 NOTE — H&P (Signed)
PATIENT NAME:  David Merritt, David Merritt MR#:  [id number] DATE OF BIRTH:  1941-08-15  DATE OF ADMISSION:  11/21/2012  PRIMARY CARE PHYSICIAN: Gerarda Fractionimothy Finnegan, MD   CHIEF COMPLAINT: Lightheadedness, shortness of breath.   HISTORY OF PRESENTING ILLNESS: This is a 74 year old Caucasian male patient with history of colon cancer, status post colectomy in 2009, and hypertension who presents to the Emergency Room complaining of shortness of breath, palpitations and lightheadedness. The patient was found to have stool positive for occult blood and hemoglobin of 7.2, and with his history of bleeding over the past few weeks he is being admitted for symptomatic anemia and GI bleed. The patient had large amount of blood last week but has noticed on and off blood in his stools, no melena, no nausea, vomiting, abdominal pain. The patient mentioned that he is presently on treatment with 5-fluorouracil as an experimental drug.   The patient was diagnosed with adenocarcinoma of the colon in 2009, had a partial colectomy and follows with Dr. Orlie DakinFinnegan.   PAST MEDICAL HISTORY: 1. History of colon cancer, status post resection and treatment.  2. Hypertension.   PAST SURGICAL HISTORY:  Right colectomy.   SOCIAL HISTORY: Smoked for 40 years but quit 9 years back. He drinks about 1/2 pint of alcohol a day.   CODE STATUS:  FULL CODE.     FAMILY HISTORY: Diabetes and hypertension.   HOME MEDICATIONS: 1. Lisinopril 10 mg oral once a day.  2. Metoprolol 12.5 mg oral once a day.   ALLERGIES: CONTRAST DYE.   REVIEW OF SYSTEMS:  CONSTITUTIONAL: Complains of fatigue, weakness. No weight, weight gain.  EYES: No blurred vision, pain or redness.   EARS, NOSE AND THROAT: No tinnitus, ear pain, hearing loss.  RESPIRATORY: No cough, wheeze, hemoptysis.  CARDIOVASCULAR: No chest pain, orthopnea, edema.  GASTROINTESTINAL: No nausea, vomiting, diarrhea. Has GI bleed.  GENITOURINARY: No dysuria, hematuria, frequency.   ENDOCRINE: No polyuria, nocturia, thyroid problems.  HEMATOLOGIC/LYMPHATIC: No anemia, easy bruising, bleeding.  INTEGUMENTARY:  No acne, rash, lesions.  MUSCULOSKELETAL: No back pain, arthritis.  NEUROLOGICAL: No focal numbness, weakness, dysarthria. Has lightheadedness.  PSYCHIATRIC:  No anxiety or depression.   PHYSICAL EXAMINATION: VITAL SIGNS: Temperature 98.9, pulse 116, blood pressure 158/62, saturating 95% on room air.  GENERAL: A morbidly obese Caucasian male patient lying in bed, comfortable, conversational, cooperative with exam.  PSYCHIATRIC: Alert, oriented x 3. Mood and affect appropriate. Judgment intact.  HEENT: Atraumatic, normocephalic. Oral mucosa moist and pink. External ears and nose normal. No pallor. No icterus.  Pupils are bilaterally equal and reactive to light.  NECK: Supple. No thyromegaly. No palpable lymph nodes. Trachea midline. No carotid bruits or JVD.  CARDIOVASCULAR: S1, S2, tachycardic without any murmurs. Peripheral pulses 2+. No edema.  RESPIRATORY: Normal work of breathing. Clear to auscultation on both sides.  GASTROINTESTINAL: Soft abdomen, nontender. Bowel sounds present. No hepatosplenomegaly palpable.  SKIN: Warm and dry. No petechiae, rash, ulcers.  MUSCULOSKELETAL: No joint swelling, redness, effusion of the large joints. Normal muscle tone.  NEUROLOGICAL: Motor strength 5 out of 5 in upper and lower extremities. Sensation to fine touch intact all over.    LABORATORY AND RADIOLOGICAL DATA:  Glucose 111, BUN 11, creatinine 0.96. Troponin is 0.03. WBC 9.4, hemoglobin 7.2, platelets of 386 with MCV 72. Blood group of O negative. INR 1.0.   Chest x-ray, portable, shows no acute disease.  EKG shows sinus tachycardia.   ASSESSMENT AND PLAN: 1. Gastrointestinal bleed causing acute blood loss anemia, which  is symptomatic: I suspect that the patient might have recurrence of his colon cancer which could be causing the bleed. The patient will be n.p.o.  except medications. Consult GI  for a possible colonoscopy. The patient will be transfused 1 unit of blood. Give IV fluids considering his dehydration and symptomatic anemia. We will follow hemoglobin with frequent hematocrit checks. No heparin or blood thinners. We will also consult oncology as the patient is presently on treatment with 5-fluorouracil to see if that needs to be continued and regarding further management.  2. Hypertension:  Continue home medications.  3. Check a CAT scan of the abdomen to look for any masses or tumors.  4. Deep vein thrombosis prophylaxis: SCDs.   CODE STATUS:  FULL CODE.     TIME SPENT: Today on this case was 50 minutes.   ____________________________ Molinda Bailiff Maude Gloor, MD srs:cb D: 11/21/2012 21:16:35 ET T: 11/21/2012 21:26:23 ET JOB#: [id number]  cc: Wardell Heath R. Kris Burd, MD, <Dictator> Tollie Pizza. Orlie Dakin, MD Orie Fisherman MD ELECTRONICALLY SIGNED 11/28/2012 14:59

## 2014-12-14 NOTE — Consult Note (Signed)
PATIENT NAME:  David FlockCRUMPLER, Edword E MR#:  [id number] DATE OF BIRTH:  [birth year]  DATE OF CONSULTATION:  11/24/2012  REFERRING PHYSICIAN:  Srikar R. Sudini, MD CONSULTING PHYSICIAN:  Marcina MillardAlexander Davan Nawabi, MD  CHIEF COMPLAINT: Lightheadedness.   REASON FOR CONSULTATION: Consultation requested for cardiovascular evaluation prior to colon surgery.   HISTORY OF PRESENT ILLNESS: The patient is a 74 year old gentleman with known history of colon cancer, status post colectomy, who presents with recurrence with anemia due to GI bleed. The patient was found to have a proximal transverse colon lesion and is scheduled to have surgery. The patient has had a history of congestive heart failure, which he reports was due to blood transfusions and not stand-alone congestive heart failure. The patient currently denies chest pain, shortness of breath, orthopnea, PND or pedal edema.   PAST MEDICAL HISTORY:  1.  Hypertension.  2.  History of colon cancer, status post resection and treatment.  3.  History of congestive heart failure with fluid overload, which occurred after transfusion of 5 units of blood.   SOCIAL HISTORY: The patient has a 40 pack-year tobacco abuse history, quit 9 years ago. The patient reports he drinks 1/2 pint of alcohol a day.   FAMILY HISTORY: No immediate family history of coronary disease or myocardial infarction.   REVIEW OF SYSTEMS:    CONSTITUTIONAL: No fever or chills.  EYES: No blurry vision.  EARS: No hearing loss.  RESPIRATORY: No shortness of breath.  CARDIOVASCULAR: No chest pain.  GASTROINTESTINAL: The patient has colon cancer as described above.  GENITOURINARY: No dysuria or hematuria.  ENDOCRINE: No polyuria or polydipsia.  MUSCULOSKELETAL: No arthralgias or myalgias.  NEUROLOGICAL: No focal muscle weakness or numbness.  PSYCHOLOGICAL: No depression or anxiety.   PHYSICAL EXAMINATION:  VITAL SIGNS: Blood pressure 132/65, pulse 80, respirations 18, temperature 97.5,  pulse oximetry 96%.  HEENT: Pupils equal and reactive to light and accommodation.  NECK: Supple without thyromegaly.  LUNGS: Clear.  HEART: Normal JVP. Normal PMI. Regular rate and rhythm. Normal S1, S2. No appreciable gallop, murmur, rub.  ABDOMEN: Soft, nontender.  EXTREMITIES: Pulses were intact bilaterally.  MUSCULOSKELETAL: Normal muscle tone.  NEUROLOGIC: The patient is alert and oriented x 3. Motor and sensory both grossly intact.   IMPRESSION: This is a 74 year old gentleman who is awaiting resection of colon cancer, who reports a history of fluid overload with congestive heart failure as it relates to a transfusion of 5 units of blood. The patient has never had stand-alone congestive heart failure. He denies prior history of myocardial infarction, diabetes, cerebrovascular accident or chronic kidney disease. The patient would be at low risk for serious cardiovascular complication during colon surgery.   RECOMMENDATIONS:  1.  Agree with overall current therapy.  2.  Proceed with colon surgery as planned.  3.  Would defer further cardiac diagnostics at this time.   ____________________________ Marcina MillardAlexander Obert Espindola, MD ap:jm D: 11/24/2012 19:52:16 ET T: 11/24/2012 20:28:06 ET JOB#: [id number]  cc: Marcina MillardAlexander Deryck Hippler, MD, <Dictator> Marcina MillardALEXANDER Lorenda Grecco MD ELECTRONICALLY SIGNED 11/29/2012 12:39

## 2014-12-14 NOTE — Consult Note (Signed)
PATIENT NAME:  David Merritt, David Merritt#:  [id number] DATE OF BIRTH:  October 09, 1940  REFERRING PHYSICIAN:   CONSULTING PHYSICIAN:  Adah Salvage. Excell Seltzer, MD DATE OF CONSULTATION: 11/24/2012.   CHIEF COMPLAINT: GI bleed.   HISTORY OF PRESENT ILLNESS: This is a patient with a history of intermittent GI bleeds over last few week, a 74 year old Caucasian male patient. He has had a prior right hemicolectomy for 2 separate colon cancers in the cecum and ascending colon. This is performed in 2009 by Dr. Michela Pitcher.  A work-up while in the hospital has demonstrated iron deficiency anemia and dysplastic polyp in the left colon, a frank carcinoma in the proximal transverse colon distal to the ileocolonic anastomosis with a patent anastomosis.  He also has multiple adenomatous polyps throughout the colon. These results discussed with Dr. Marva Panda.  The patient denies weight loss, denies fevers or chills.  PAST MEDICAL HISTORY:  Colon cancer, hypertension and congestive heart failure.   MEDICATIONS: Lisinopril and metoprolol.   FAMILY HISTORY:  Of diabetes and hypertension.   ALLERGIES:  CONTRAST DYE.   SOCIAL HISTORY: The patient does not smoke. He does drink alcohol approximately 4 drinks of whiskey a day, mostly in the morning. He is retired but works in a [employer].   REVIEW OF SYSTEMS:  A 10 system review has been performed and negative with the exception of that mentioned in the history of present illness.   ADDITIONAL HISTORY: The patient has not had a colonoscopy prior to this one since his resection in 2009.   PHYSICAL EXAMINATION: GENERAL: Pale, elderly male patient. VITAL SIGNS:  Temperature 97.7, pulse 82, respirations 18, blood pressure 146/66, 99% room air sat.  HEENT: No scleral icterus.  NECK: No palpable neck nodes.  CHEST: Clear to auscultation.  CARDIAC: Regular rate and rhythm.  ABDOMEN: Soft, nontender. Laparoscopy scars are noted.  EXTREMITIES: Without edema. Calves are nontender.   NEUROLOGIC: Grossly intact.  INTEGUMENT: No jaundice.   LABORATORY AND DIAGNOSTIC DATA:  Colonoscopy and pathology reports were reviewed and discussed with Dr. Marva Panda.   Hemoglobin and hematocrit is 8.1 and 25.7, white blood cell count of 8.8, platelet count 314.  Albumin and total protein are within normal limits. CT scan is reviewed; no sign of metastatic disease to.   ASSESSMENT AND PLAN: This is a patient with multiple polyps, a history of colon cancer the right colon which has been removed and a new cancer distal to the anastomosis in the ileocolonic area of the transverse colon. He also has a dysplastic polyp in the sigmoid colon at 26 cm, which has been removed and multiple tubular adenomas.   I discussed with Dr. Michela Pitcher, his original surgeon.  I discussed with Dr. Marva Panda and with [facility], Dr. Nemiah Commander the discussion about options and we discussed the options and rationale for offering either a limited resection for the colon cancer or a subtotal colectomy and the risks of chronic diarrhea from subtotal colectomy as well as a potential for an ileostomy. We also discussed limited resection and surveillance. The patient has not had surveillance since his original cancer was diagnosed back in 2009 and would benefit from at least every 2 years, possibly every year surveillance with the number of tubular adenomas that this patient had. He is elderly, he has medical problems and subtotal colectomy could be performed but may not be in the patient's best interest. The patient understood these options and wanted to think about it.   My plan is to have him  discharged on the 4th on iron and to follow up in the operating room with  plans on Thursday to perform either a subtotal colectomy or a limited resection. The rationale for this has been discussed. The risks of bleeding, infection, anastomosis, anastomotic leak, ostomy, ileostomy or colostomy and recurrent cancers versus chronic diarrhea were all  discussed with him as was the need for a more aggressive surveillance. He wishes to think about this and give us an answer at the day of surgery.  My office will call him on Monday with the instructions for prep and surgical intervention. He understood and agreed to proceed with this.     ____________________________ Adah Salvageichard E. Excell Seltzerooper, MD rec:ct D: 11/25/2012 12:19:25 ET T: 11/25/2012 12:29:47 ET JOB#: [id number]  cc: Adah Salvageichard E. Excell Seltzerooper, MD, <Dictator> Lattie HawICHARD E Deborra Phegley MD ELECTRONICALLY SIGNED 11/25/2012 14:06

## 2014-12-14 NOTE — Op Note (Signed)
PATIENT NAME:  David Merritt, David Merritt MR#:  [id number] DATE OF BIRTH:  [birth year]  DATE OF PROCEDURE:  12/01/2012  PREOPERATIVE DIAGNOSIS: Transverse colon cancer.   POSTOPERATIVE DIAGNOSIS: Transverse colon cancer.   PROCEDURE PERFORMED: Limited transverse colectomy with ileocolonic anastomosis.   SURGEON: Amrit Erck E. Excell Seltzer, MD  ASSISTANT:  Tiney Rouge, MD.  STUDENT ASSISTANTS:  Katie Shuler and Delma Freeze, PA-S  INDICATIONS: This is a patient with a history of prior right hemicolectomy for colon cancer who presents several years later with another colon cancer in the area of the proximal transverse colon near the ileocolonic anastomosis. He also has multiple polyps and probable strong family history for colon cancer.  In his elderly state with medical problems we discussed the options of a limited colon resection versus a subtotal colectomy. He has chosen limited colon resection. We discussed the risks of bleeding, infection, anastomosis and anastomotic leak, temporary or permanent ostomy, either ileostomy or colostomy, the risks of transfusion as well as recurrent tumor, the need for vigilant colonoscopy in the future, and the risks of diarrhea on long term. This was all reviewed for him in the preop holding area. He understood and agreed to proceed and chose limited resection.   FINDINGS: Approximately 3 cm colon mass palpable transmurally in the area of the prior anastomosis distal to it. There was also an area of thickened mesentery suggestive of lymphadenopathy versus old scar tissue. Clips were present in this area and this was in the small bowel mesentery. It was sent off as well, en block.   DESCRIPTION OF PROCEDURE: The patient was induced to general anesthesia, given IV antibiotics. VT prophylaxis was in place. He was prepped and draped in a sterile fashion. A Foley catheter had been placed as well.   A midline incision was utilized to open and explore the abdominal cavity. Adhesions  of omentum to anterior abdominal wall were taken down sharply and with electrocautery dissection. The abdominal cavity was explored. There was no sign of liver lesions, no palpable gallstones and no gallbladder wall thickening. The transverse colon lesion was palpable. Adhesions were taken down laterally down to the duodenum, which was kept in view and protected. The location for division, at the terminal ileum, was decided upon and a GIA was fired across the terminal ileum. Further dissection medially to the transverse colon was performed and a site was chosen as well distal to the palpable lesion. This was divided with the GIA and then the mesentery was taken between clamps and ligated with 0 silk ligatures. This included en bloc resection of the area of possible lymphadenopathy versus scar from prior surgery in the small bowel mesentery. Clips were evident in this area as well.   Hemostasis was with the use of silk ligatures or large clips and once assuring that hemostasis was adequate an anastomosis was performed in a side-to-side handsewn fashion utilizing an inner layer of 3-0 Monocryl, running Connell-type suture, followed by both outer layers of 3-0 silk inverting type sutures. The anastomosis was palpable and found to be patent. The rent in the mesentery was closed with figure-of-eight 3-0 silks as well and then nasogastric tube placement was confirmed. The sponge, lap and needle count was correct at this time and there was no sign of further bleeding.  A piece of Seprafilm was placed into the abdominal cavity and the wound was closed with running #1 PDS. Marcaine was infiltrated in the skin and subcutaneous tissues for a total of 30 mL and then skin  staples were placed after irrigating the wound. A sterile dressing was placed.   The patient tolerated this procedure well. There were no complications. He was taken to the recovery room in stable condition to be admitted for continued care. Sponge, lap and  needle count was correct.  EBL was 50 mL. ____________________________ Adah Salvageichard E. Excell Seltzerooper, MD rec:sb D: 12/01/2012 14:10:00 ET T: 12/01/2012 14:45:57 ET JOB#: [id number]  cc: Adah Salvageichard E. Excell Seltzerooper, MD, <Dictator> Lattie HawICHARD E Janit Cutter MD ELECTRONICALLY SIGNED 12/05/2012 19:13

## 2014-12-14 NOTE — Consult Note (Signed)
PATIENT NAME:  David Merritt, David Merritt MR#:  [id number] DATE OF BIRTH:  August 14, 1941  DATE OF CONSULTATION:  11/22/2012  REFERRING PHYSICIAN:  Milagros Loll, MD CONSULTING PHYSICIAN:  Barnetta Chapel, MD / Hardie Shackleton. Korena Nass, PA-C  REASON FOR CONSULTATION: Gastrointestinal bleed and anemia.   HISTORY OF PRESENT ILLNESS: This is a pleasant 74 year old gentleman with a past medical history significant for colon cancer who is status post right partial colectomy in 2009. He required adjuvant chemotherapy at that time and is established with the [facility]. He initially presented to the Emergency Department with concerns of increased shortness of breath and lightheadedness who upon further workup was found to be anemic with an initial hemoglobin of 7.2. He has since been transfused 2 units of packed red blood cells and his hemoglobin has bumped up to 8.6. He states that over the past few weeks he has noted occasional rectal bleeding that is bright red blood in color. This was associated with diarrhea and "the squirts" and occurred approximately 4 to 5 times. He states that he had been taking Alka-Seltzer, TUMS and ibuprofen recently. He denies any nausea or vomiting. He denies any signs of melena. There is no associated abdominal pain or rectal pain. His weight has been stable. He does however note mild decrease in appetite over the past few weeks. Since being transfused his shortness of breath and lightheadedness have resolved. He states his last colonoscopy was done prior to his colon surgery back in 2009. No current fever or chills. No chest pain or shortness of breath.   ALLERGIES: SEASONAL ALLERGIES AND CONTRAST DYE.   PAST MEDICAL HISTORY: Colon cancer status post partial colectomy with neoadjuvant chemotherapy. Also hypertension.   PAST SURGICAL HISTORY: Right partial colectomy in 2009 by Dr. Michela Pitcher.   SOCIAL HISTORY: The patient does have a remote history of tobacco use for approximately 40 years. He quit 9  years ago. 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   FAMILY HISTORY: There is no known family history of GI malignancy or IBD.   HOME MEDICATIONS: Lisinopril, metoprolol and 5-fluorouracil.   REVIEW OF SYSTEMS: A 10 system review was achieved on the patient. All pertinent positives are mentioned above and are otherwise negative.   PHYSICAL EXAMINATION:  VITAL SIGNS: Blood pressure 136/68, heart rate 85, respirations 18, temperature 98.1 and bedside pulse ox 96%.  GENERAL: This is a pleasant 74 year old gentleman resting quietly and comfortably in bed in no acute distress, alert and oriented x 3.  HEAD: Atraumatic, normocephalic.  NECK: Supple. No lymphadenopathy noted.  HEENT: Sclerae anicteric. Mucous membranes moist.  LUNGS: Respirations are even and unlabored, clear to auscultation in bilateral anterior lung fields.  HEART:  Regular rate and rhythm. S1 and S2 noted.  ABDOMEN: Soft, nontender and nondistended. Normoactive bowel sounds noted in all 4 quadrants. No masses palpated. No guarding or rebound. No hernias, masses or organomegaly is appreciated. However, exam is limited secondary to obese habitus.  RECTAL: Deferred.  EXTREMITIES: Negative for lower extremity edema, 2+ pulses noted bilaterally.  PSYCHIATRIC: Appropriate mood and affect. NEUROLOGIC:  Cranial nerves II through XII are grossly intact. Alert and oriented x 3. RECTAL:  Done in the ER was heme-positive   LABORATORY DATA: White blood cells 9.9, hemoglobin 8.6 up from 7.2, hematocrit 26.8 and platelets 361. Sodium 140, potassium 3.8, BUN 10, creatinine 0.98 and glucose 104. MCV 75. INR 1.0. PT 13.4. Cardiac enzymes have been negative.  IMAGING: A CT of the abdomen and pelvis without contrast was obtained. No evidence of abdominal or pelvic pathology. Stomach, duodenum, small intestine and colon were unremarkable. Pancreas appeared normal. There was a small fat containing umbilical  hernia.   ASSESSMENT: 1.  Heme-positive stools.  2.  Symptomatic anemia with recent bright red blood per rectum and diarrhea episodes.  3.  Personal history of colon cancer status post right partial colectomy in 2009.   PLAN: I have discussed this patient's case in detail with Dr. Barnetta ChapelMartin Skulskie who is involved in the development of the patient's plan of care. At this time we do recommend obtaining a CEA level. We do also want to proceed with a diagnostic colonoscopy for the indication of heme-positive stool and symptomatic anemia. Alternatives, risks and benefits of the procedure were discussed in detail with the patient who verbalized understanding and is in agreement. We are comfortable with the patient having a clear liquid diet today avoiding all red and carbonation and keeping him n.p.o. after midnight. We can proceed with colonoscopy tomorrow afternoon with Dr. Barnetta ChapelMartin Skulskie. All questions were answered. Further recommendations pending the endoscopic intervention and per clinical course.   Thank you so much for this consultation and for allowing us to participate in the patient's plan of care.   The above was discussed and agreed upon under supervisory agreement between myself and Dr. Barnetta ChapelMartin Skulskie. ____________________________ Hardie ShackletonKaryn M. Shin Lamour, PA-C kme:sb D: 11/22/2012 13:38:42 ET T: 11/22/2012 14:08:44 ET JOB#: [id number]  cc: Hardie ShackletonKaryn M. Yelitza Reach, PA-C, <Dictator> Hardie ShackletonKARYN M Linah Klapper PA ELECTRONICALLY SIGNED 11/23/2012 10:39

## 2014-12-14 NOTE — Consult Note (Signed)
Brief Consult Note: Diagnosis: proximal transverse colon lesion.   Patient was seen by consultant.   Consult note dictated.   Recommend to proceed with surgery or procedure.   Comments: will need exploration and colon resection I have rev'd prior op report from 2009 Hx of CHF, will need preop clearance will discuss further with pt.  Electronic Signatures: Lattie Hawooper, Richard E (MD)  (Signed 03-Apr-14 17:14)  Authored: Brief Consult Note   Last Updated: 03-Apr-14 17:14 by Lattie Hawooper, Richard E (MD)

## 2014-12-14 NOTE — Consult Note (Signed)
Chief Complaint:  Subjective/Chief Complaint seen for rectal bleeding.  patient doing well, no n/v or ab dominal pain, tolerating po.   VITAL SIGNS/ANCILLARY NOTES: **Vital Signs.:   03-Apr-14 14:25  Vital Signs Type Routine  Temperature Temperature (F) 97.7  Celsius 36.5  Pulse Pulse 82  Respirations Respirations 18  Systolic BP Systolic BP 146  Diastolic BP (mmHg) Diastolic BP (mmHg) 66  Mean BP 92  Pulse Ox % Pulse Ox % 99  Pulse Ox Activity Level  At rest  Oxygen Delivery Room Air/ 21 %   Brief Assessment:  Cardiac Regular   Respiratory clear BS   Gastrointestinal details normal Soft  Nontender  Nondistended  No masses palpable  Bowel sounds normal  protuberant   Lab Results: Pathology:  02-Apr-14 00:12   Pathology Report ========== TEST NAME ==========  ========= RESULTS =========  = REFERENCE RANGE =  PATHOLOGY REPORT  Pathology Report .                               [   Final Report         ]                   Material submitted:         Marland Kitchen PART A: ANTRAL AND BODY OF STOMACH COLD BIOPSIES *STAT* PART B: GEJ COLD BIOPSIES *STAT* PART C: POLYP AT ANASTOMOSIS COLD BIOPSIES *STAT* PART D: MASS 10CM DISTAL TO ANASTOMOSIS COLD BIOPSIES *STAT* PART E: DISTAL TRANSVERSE COLON MULTIPLE POLYPS COLD BIOPSIES *STAT* PART F: DESCENDING COLON MULTIPLE POLYPS COLD BIOPSIES *STAT* PART G: SIGMOID COLON MULTIPLE POLYPS COLD BIOPSIES *STAT* PART H: POLYP AT 26CM HOT SNARED *STAT* PART I: DISTAL SIGMOID COLON MULTIPLE POLYPS COLD BIOPSIES *STAT* PART J: RECTAL MULTIPLE POLYPS COLD BIOPSIES *STAT* .                               [   Final Report         ]                   Pre-operative diagnosis:                                        . RECTAL BLEEDING, HEME + STOOL .                         [   Final Report         ]                   ********************************************************************** Diagnosis: Part A: ANTRUM AND BODY OF STOMACH COLD BIOPSIES: - ANTRAL  AND OXYNTIC MUCOSA WITH MILD REACTIVE GASTROPATHY. - NO ATROPHY OR INTESTINAL METAPLASIA. - NEGATIVE FOR HELICOBACTER PYLORI ON DIFF-QUIK STAIN. Marland Kitchen Part B: GASTROESOPHAGEAL JUNCTION COLD BIOPSIES: - COLUMNAR-LINED MUCOSA WITH MILD CHRONIC INFLAMMATION. - NEGATIVE FOR GOBLET CELLS,DYSPLASIA AND MALIGNANCY. . Part C: POLYP AT ANASTOMOSIS COLD BIOPSIES: - TUBULAR ADENOMA. - NEGATIVE FOR HIGH GRADE DYSPLASIA AND MALIGNANCY. . Part D: MASS 10 CM DISTAL TO ANASTOMOSIS COLD BIOPSIES: - INVASIVE ADENOCARCINOMA, SEE NOTE BELOW. . Part E: DISTAL TRANSVERSE COLON MULTIPLE POLYPS COLD BIOPSIES: - TUBULAR ADENOMAS, 2 FRAGMENTS. - NEGATIVE FOR HIGH GRADE DYSPLASIA AND  MALIGNANCY. . Part F: DESCENDING COLON MULTIPLE POLYPS COLD BIOPSIES: - TUBULAR ADENOMAS, 2 FRAGMENTS. -NEGATIVE FOR HIGH GRADE DYSPLASIA AND MALIGNANCY. . Part G: SIGMOID COLON MULTIPLE POLYPS COLD BIOPSIES: - TUBULAR ADENOMAS, 2 FRAGMENTS, NEGATIVE FOR HIGH GRADE DYSPLASIA AND MALIGNANCY. - HYPERPLASTIC POLYPS, 4 FRAGMENTS. Marland Kitchen. 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 - NEGATIVE FOR HIGH GRADE DYSPLASIA AND MALIGNANCY. . Part I: DISTAL SIGMOID COLON MULTIPLE POLYPS COLD BIOPSIES: - HYPERPLASTIC POLYPS, 4 FRAGMENTS. Marland Kitchen. Part J: RECTAL MULTIPLE POLYPS COLD BIOPSIES: - TUBULAR ADENOMA, 1 FRAGMENT, NEGATIVE FOR HIGH GRADE DYSPLASIA AND MALIGNANCY. - HYPERPLASTIC POLYPS, 5 FRAGMENTS. Marland Kitchen. NOTE: Invasive adenocarcinoma is present in part D, the mass 10 cm distal toanastomosis. According to Dr. Reyes IvanSkulskie's procedure report, the tumor site is the proximal transverse colon. . In H, the colon polyp at 26 cm, there is a sessile serrated adenoma with areas of conventional low grade cytologic dysplasia. Correlation with clinical appearance is advised with regard to completeness of removal. Adenomas of this type may progress more rapidly to adenocarcinoma than the  conventional adenoma. . Intradepartmental case review was performed. Dr. Marva PandaSkulskie was informed of the diagnoses on the colon biopsies at 1:05 PM on 11/24/12. . MSO/11/24/2012 ********************************************************************** .                               [   Final Report         ]                   Electronically signed:                                     Marland Kitchen. [name], MD, Pathologist .                               [   Final Report         ]                   Gross description:                                         . All of the samples are received informalin-filled containers labeled David Merritt: A. Received labeled antrum and body of stomach cold biopsies is a 0.4 cm tan pink soft tissue fragment, entirely submitted in cassette A1. . B. Received labeled GEJ cold biopsies are two tan pink soft tissue fragments, 0.3 cm each. Entirely submitted in cassette B1. . C. Received labeled polyp at anastomosis cold biopsy is a 0.3 cm tan pink soft tissue fragment, entirely submitted in cassette C1. . D. Received labeled mass 10 cm distal to anastomosis cold biopsy are five tan pink soft tissue fragments ranging from 0.2 to 0.3 cm in greatest dimensions. Entirely submitted in cassette D1. . E. Received labeled distal transverse colon multiple polyps cold biopsies are two tan pink soft tissue fragments each 0.3 cm in greatest dimensions. Entirely submitted in cassette E1. . F. Received labeled descending colon multiple polyps cold biopsies are three tan pink soft tissue fragments ranging from 0.1 to 0.3 cm in greatest dimensions.  The smallest fragment may not survive processing. Entirely submitted in cassette F1. Reece Agar. Received labeled sigmoid colon multiple polyps cold biopsies are multiple fragments ranging from 0.1 to 0.3 cm. The smallest fragments may not survive processing. Entirely submitted in cassette G1. . H. Received labeled polyp at 26 cm hot  snare is a 0.5 cm tan pink soft tissue fragment, entirely submitted in cassette H1. . I. Received labeled distal sigmoid colon polyps multiple cold biopsy are four tan pink soft tissue fragments ranging from 0.2 to 0.4 cm. Entirely submitted in cassette I1. . J. Received labeled rectal polyps multiple cold biopsies are multiple fragments ranging from 0.1 to 0.2 cm. Entirely submitted in cassette J1. QAC/KCT .                               [   Final Report         ]                   Pathologist provided ICD-9: 211.3, 153.1, 211.4 .                               [   Final Report         ]                   CPT                              . [id number], [id number], [id number], [id number], [id number], [id number], [id number], [id number], [phone], [phone], [phone]               [facility]            No: [id number]           [address], [place], [place] [address]           Mila Homer, MD         [phone]                                         [id number]   Result(s) reported on 24 Nov 2012 at 07:18PM.  Routine Hem:  03-Apr-14 05:50   WBC (CBC) 8.8  RBC (CBC)  3.45  Hemoglobin (CBC)  8.1  Hematocrit (CBC)  25.7  Platelet Count (CBC) 314  MCV  75  MCH  23.6  MCHC  31.6  RDW  20.5  Neutrophil % 74.7  Lymphocyte % 13.9  Monocyte % 8.2  Eosinophil % 2.8  Basophil % 0.4  Neutrophil #  6.6  Lymphocyte # 1.2  Monocyte # 0.7  Eosinophil # 0.2  Basophil # 0.0 (Result(s) reported on 24 Nov 2012 at 06:38AM.)   Assessment/Plan:  Assessment/Plan:  Assessment 1) rectal bleeding.  mass seen on colonocsopy consistant with colon cancer.  many colon polyps throughout the colon, these a mix of adenoma and hyperplastic.  This was only a smll sample of the total number of polyps.  It is not feasible to remove this number of polyps endoscopically.   Plan 1) case discussed with Dr Excell Seltzer.  will await further input from oncology and surgery.   Electronic Signatures: Barnetta Chapel (MD)  (Signed 03-Apr-14 19:32)  Authored: Chief Complaint, VITAL SIGNS/ANCILLARY NOTES, Brief Assessment, Lab Results, Assessment/Plan   Last Updated: 03-Apr-14 19:32 by Barnetta Chapel (MD)

## 2014-12-14 NOTE — Discharge Summary (Signed)
PATIENT NAME:  David Merritt, David E MR#:  [id number] DATE OF BIRTH:  [birth year]  DATE OF ADMISSION:  12/01/2012 DATE OF DISCHARGE:  12/06/2012  DISCHARGE DIAGNOSES:  1.  Hypertension. 2.  Obesity. 3.  Colon cancer. 4.  Anemia, present preoperatively.  PROCEDURES: Exploratory laparotomy, colon resection.   CONSULTANTS: None.   HISTORY OF PRESENT ILLNESS AND HOSPITAL COURSE: This is a patient who has had a prior right hemicolectomy for multifocal colon cancer in 2009. He had not had a colonoscopy follow-up in those years until now when he presented to the Emergency Room 2 weeks ago with GI bleeding.  A work-up showed a dysplastic polyp at 26 cm in the rectum, which was excised, multiple small polyps, adenomatous in nature, and a frank carcinoma distal to the ileocolonic anastomosis, not suggesting a recurrence.   The patient was counseled concerning his options and risks about subtotal colectomy versus limited resection. He chose limited resection because he did not want to have the risk of diarrhea and he agrees and understands that he will need frequent yearly colonoscopies so that any sign of a progression from an adenomatous polyp to a frank cancer could be removed endoscopically and this was discussed with him preoperatively and postoperatively and he understands this regimen.    The patient was taken to the operating room where limited resection was performed to the middle colic vessels. The mass was measuring approximately 2.5 cm. It was palpable transmurally near but distal to the ileocolonic anastomosis.  A new ileocolonic anastomosis was constructed via handsewn technique and he was placed on the surgical floor with a nasogastric tube and Foley catheter and PCA pump.   The patient made a noncomplicated postoperative recovery. He is currently tolerating a regular diet, having bowel movements and passing gas. He also has an anemia from his chronic blood loss from a cancer that had been  undiagnosed until 2 weeks ago. He is discharged in stable condition on a regular diet with oral analgesics, lisinopril, metoprolol and ferrous sulfate with instructions to follow up in my office next week for staple removal. He is not to do any heavy lifting, and he is instructed about bleeding or redness or drainage from his wound, to call our office immediately. He understood and anticipates discharge tomorrow morning.   ADDENDUM:  Pathology is currently pending.  Prior pathology showed adenocarcinoma in this lesion, but lymph node analysis is not known at this time. He may require medical oncology consultation based on the pathology report once it is available. ____________________________ Adah Salvageichard E. Excell Seltzerooper, MD rec:sb D: 12/05/2012 20:26:00 ET T: 12/06/2012 07:10:58 ET JOB#: [id number]  cc: Adah Salvageichard E. Excell Seltzerooper, MD, <Dictator> Lattie HawICHARD E Rayla Pember MD ELECTRONICALLY SIGNED 12/06/2012 20:40

## 2014-12-23 IMAGING — CT CT ABD-PELV W/O CM
1 of 2 series · 15 of 32 positions shown, 19 images · non-contrast
Comparison: 12/03/2009

REASON FOR EXAM: (1) gi bleed. H/O colon cancer. Allergy to dye; (2) gi
bleed. H/O colon cancer
COMMENTS:

PROCEDURE:     CT  - CT ABDOMEN AND PELVIS W[DATE]  [DATE]
RESULT:     Indication: GI bleed
TECHNIQUE: Multiple axial images from the lung bases to the symphysis pubis
were obtained without oral and without intravenous contrast.

[Series 2: 3mm soft tissue · axial · 0.80mm/px · z∈[-536,-94]mm · 15 of 162 slices shown, 19 images]
[im 8/162  soft-tissue]
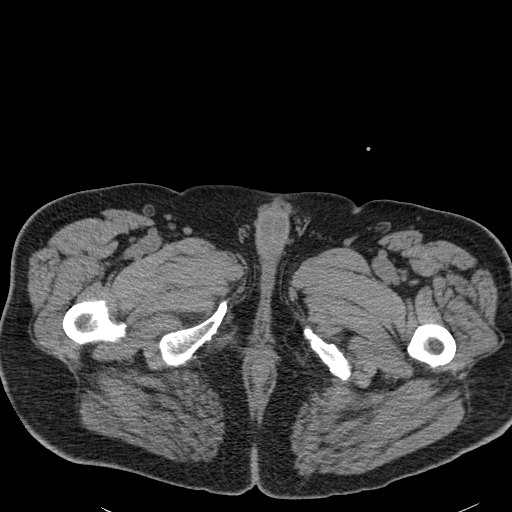
[im 8/162  bone]
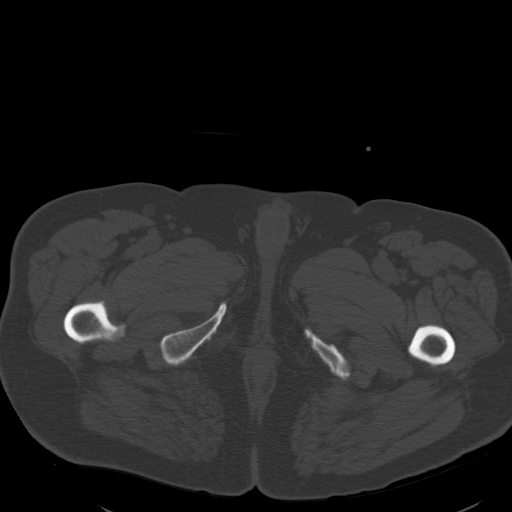
[im 22/162  soft-tissue]
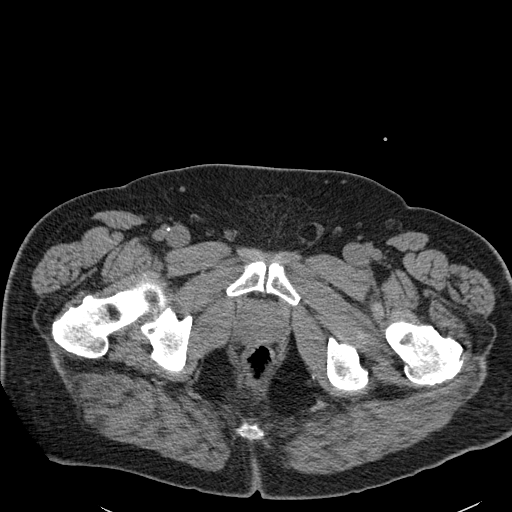
[im 36/162  soft-tissue]
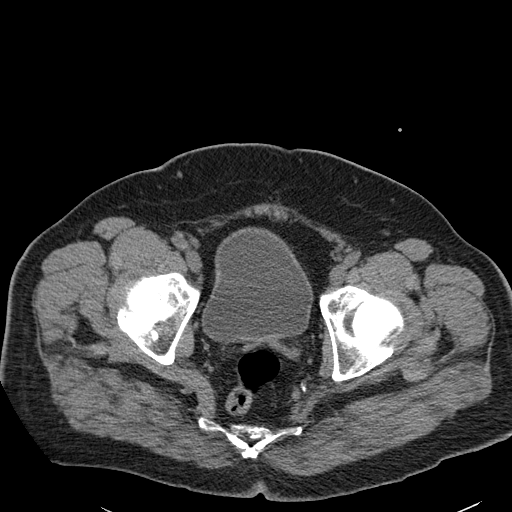
[im 43/162  soft-tissue]
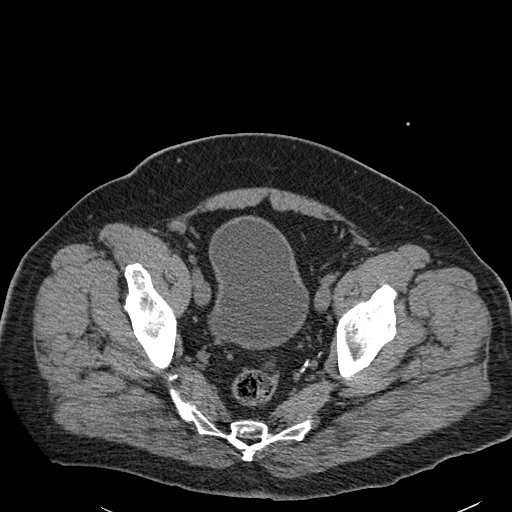
[im 57/162  soft-tissue]
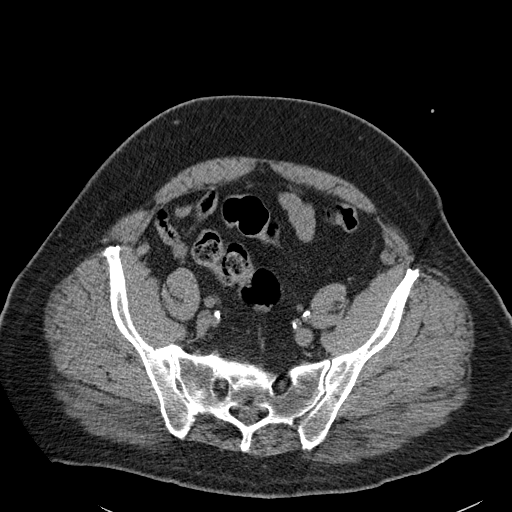
[im 71/162  soft-tissue]
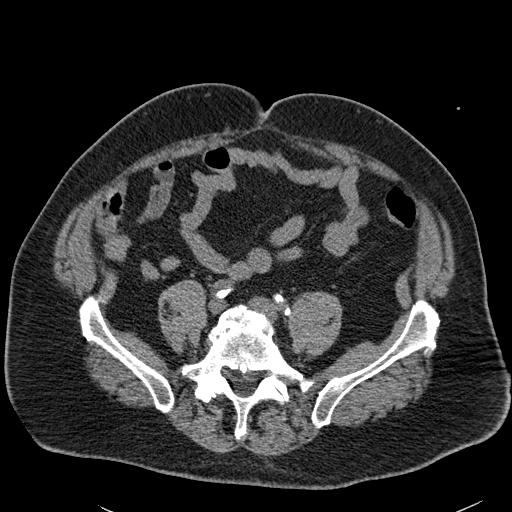
[im 85/162  soft-tissue]
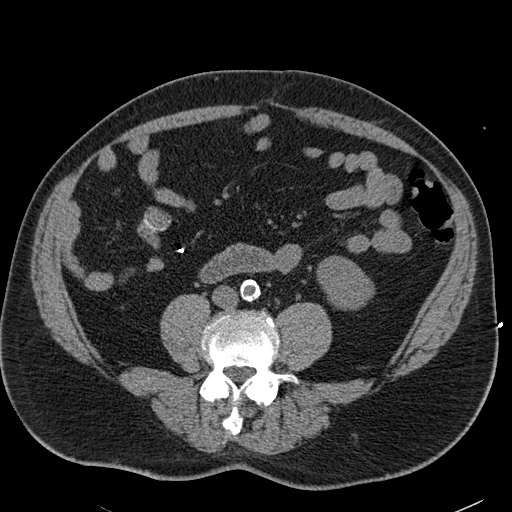
[im 92/162  soft-tissue]
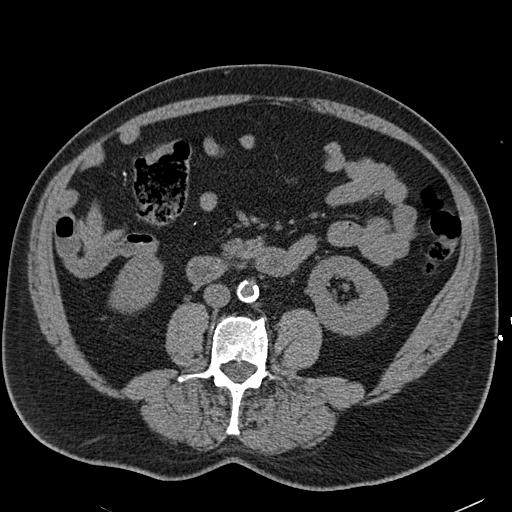
[im 106/162  soft-tissue]
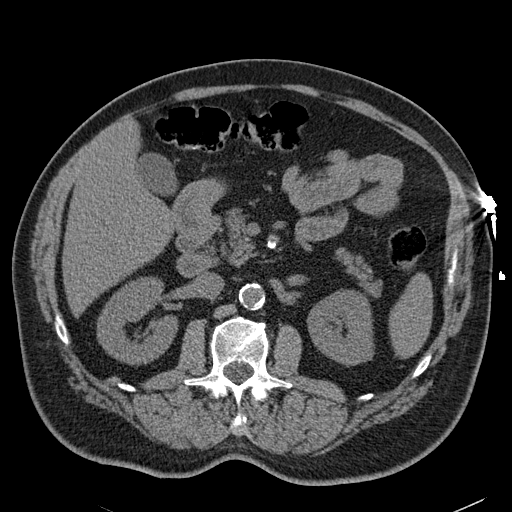
[im 106/162  bone]
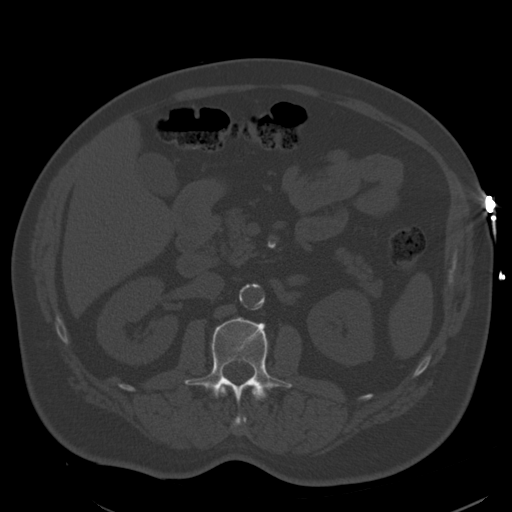
[im 120/162  soft-tissue]
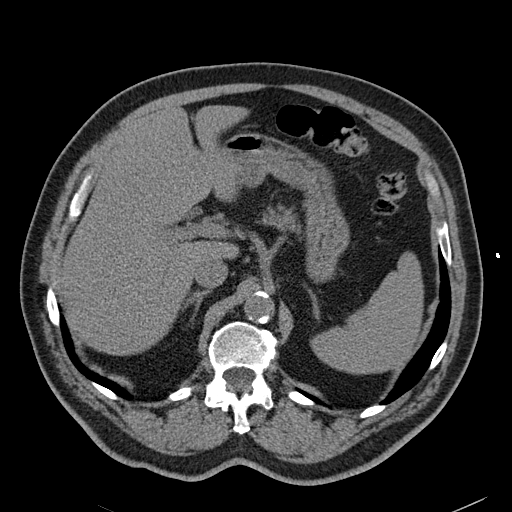
[im 127/162  soft-tissue]
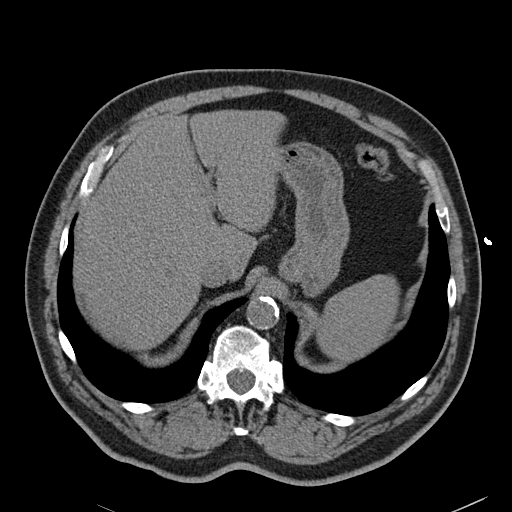
[im 134/162  lung]
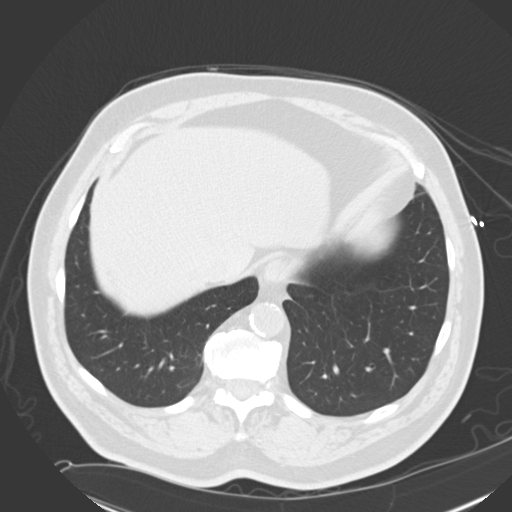
[im 141/162  soft-tissue]
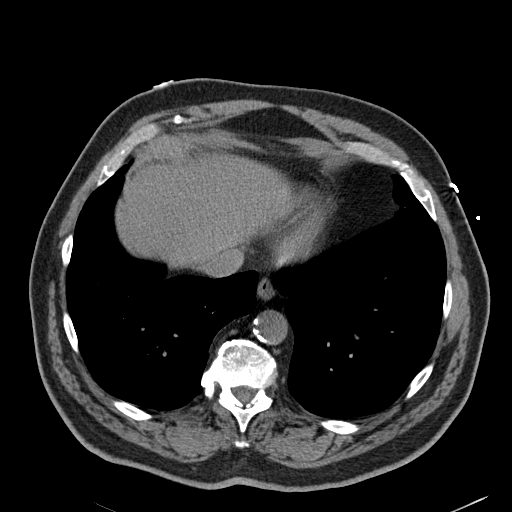
[im 141/162  lung]
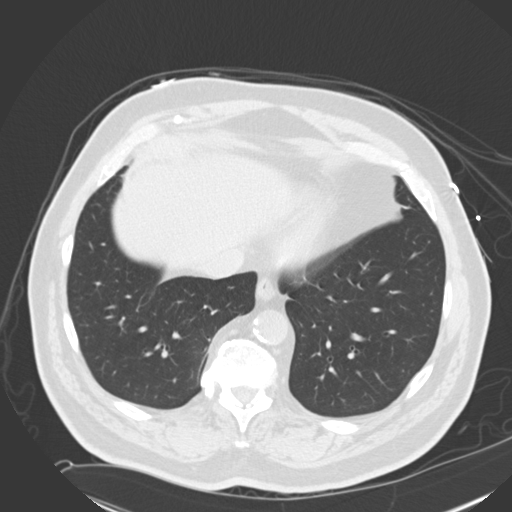
[im 148/162  lung]
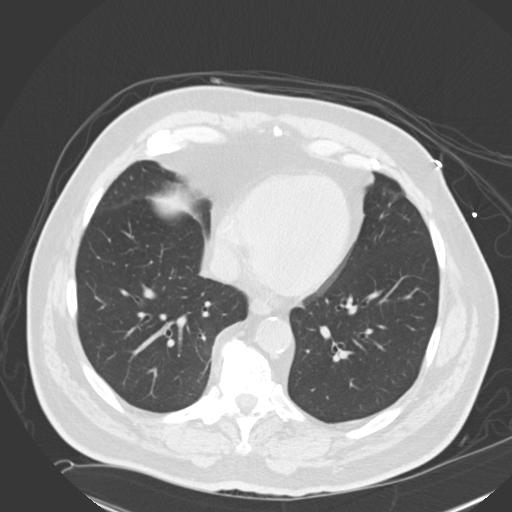
[im 155/162  soft-tissue]
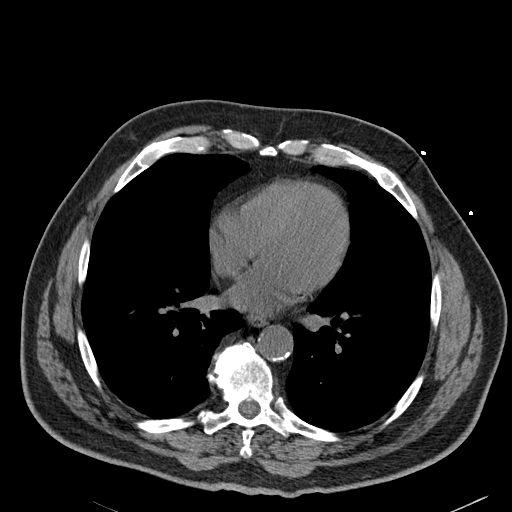
[im 155/162  lung]
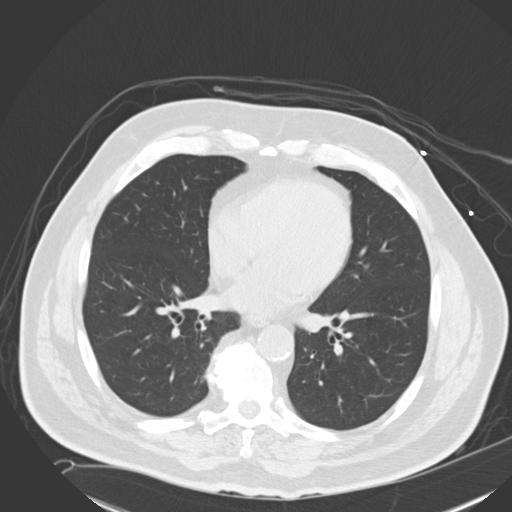

[15 of 32 positions shown; findings below may reference images not displayed]

FINDINGS: The lung bases are clear. There is no pleural or pericardial effusions.

No renal, ureteral, or bladder calculi. No obstructive uropathy. No
perinephric stranding is seen. The kidneys are symmetric in size without
evidence for exophytic mass. The bladder is unremarkable.

The liver demonstrates no focal abnormality. The gallbladder is
unremarkable. The spleen demonstrates no focal abnormality. The adrenal
glands and pancreas are normal.

There is a small fat-containing umbilical hernia. The unopacified stomach,
duodenum, small intestine, and large intestine are unremarkable, but
evaluation is limited by lack of oral contrast.  There is no
pneumoperitoneum, pneumatosis, or portal venous gas. There is no abdominal
or pelvic free fluid. There is no lymphadenopathy.

The abdominal aorta is normal in caliber with atherosclerosis.

There is lumbar spine spondylosis.
IMPRESSION: 1. No acute abdominal or pelvic pathology.

[REDACTED]

## 2014-12-23 IMAGING — CR DG CHEST 1V PORT
1 series · 2 of 2 positions shown · non-contrast
Comparison: none

REASON FOR EXAM: Chest Pain
COMMENTS:

PROCEDURE:     DXR - DXR PORTABLE CHEST SINGLE VIEW  - November 21, 2012  [DATE]
RESULT:     Comparison: None

[Series 1: ap · 0.17mm/px · 2 of 2 slices shown]
[im 1/2]
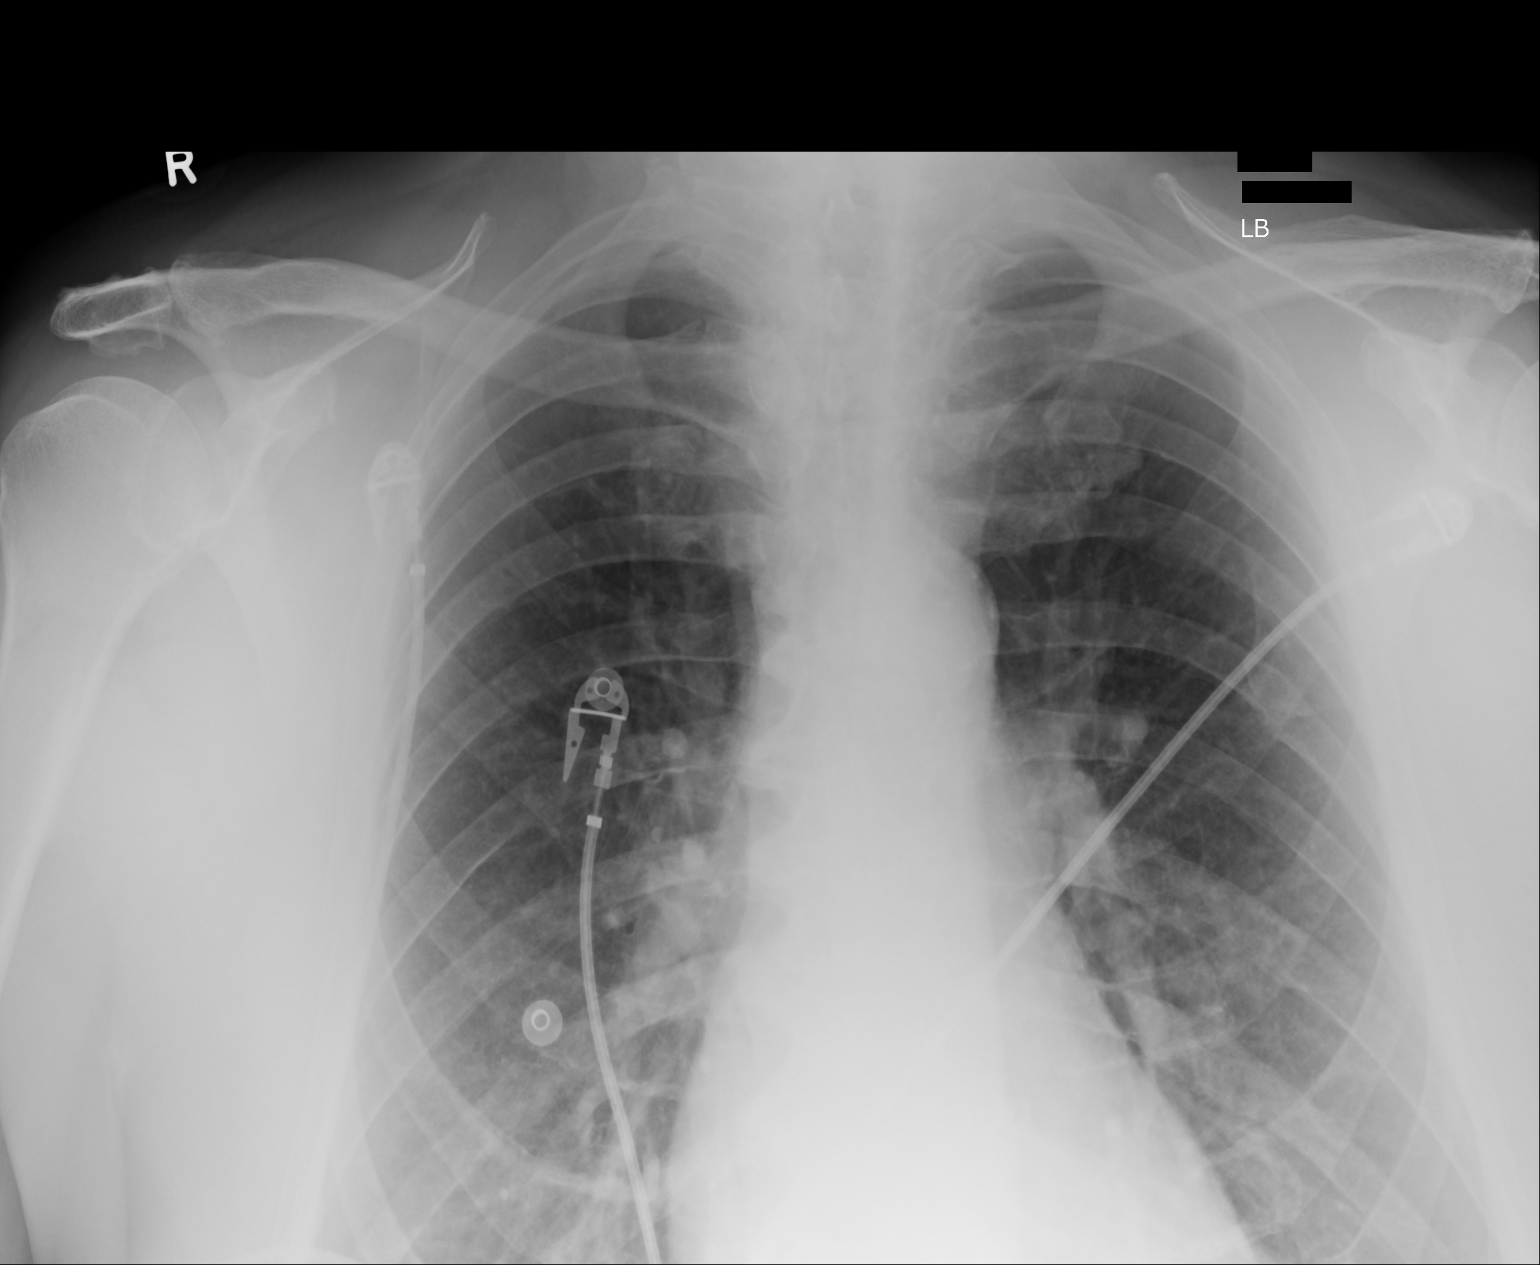
[im 2/2]
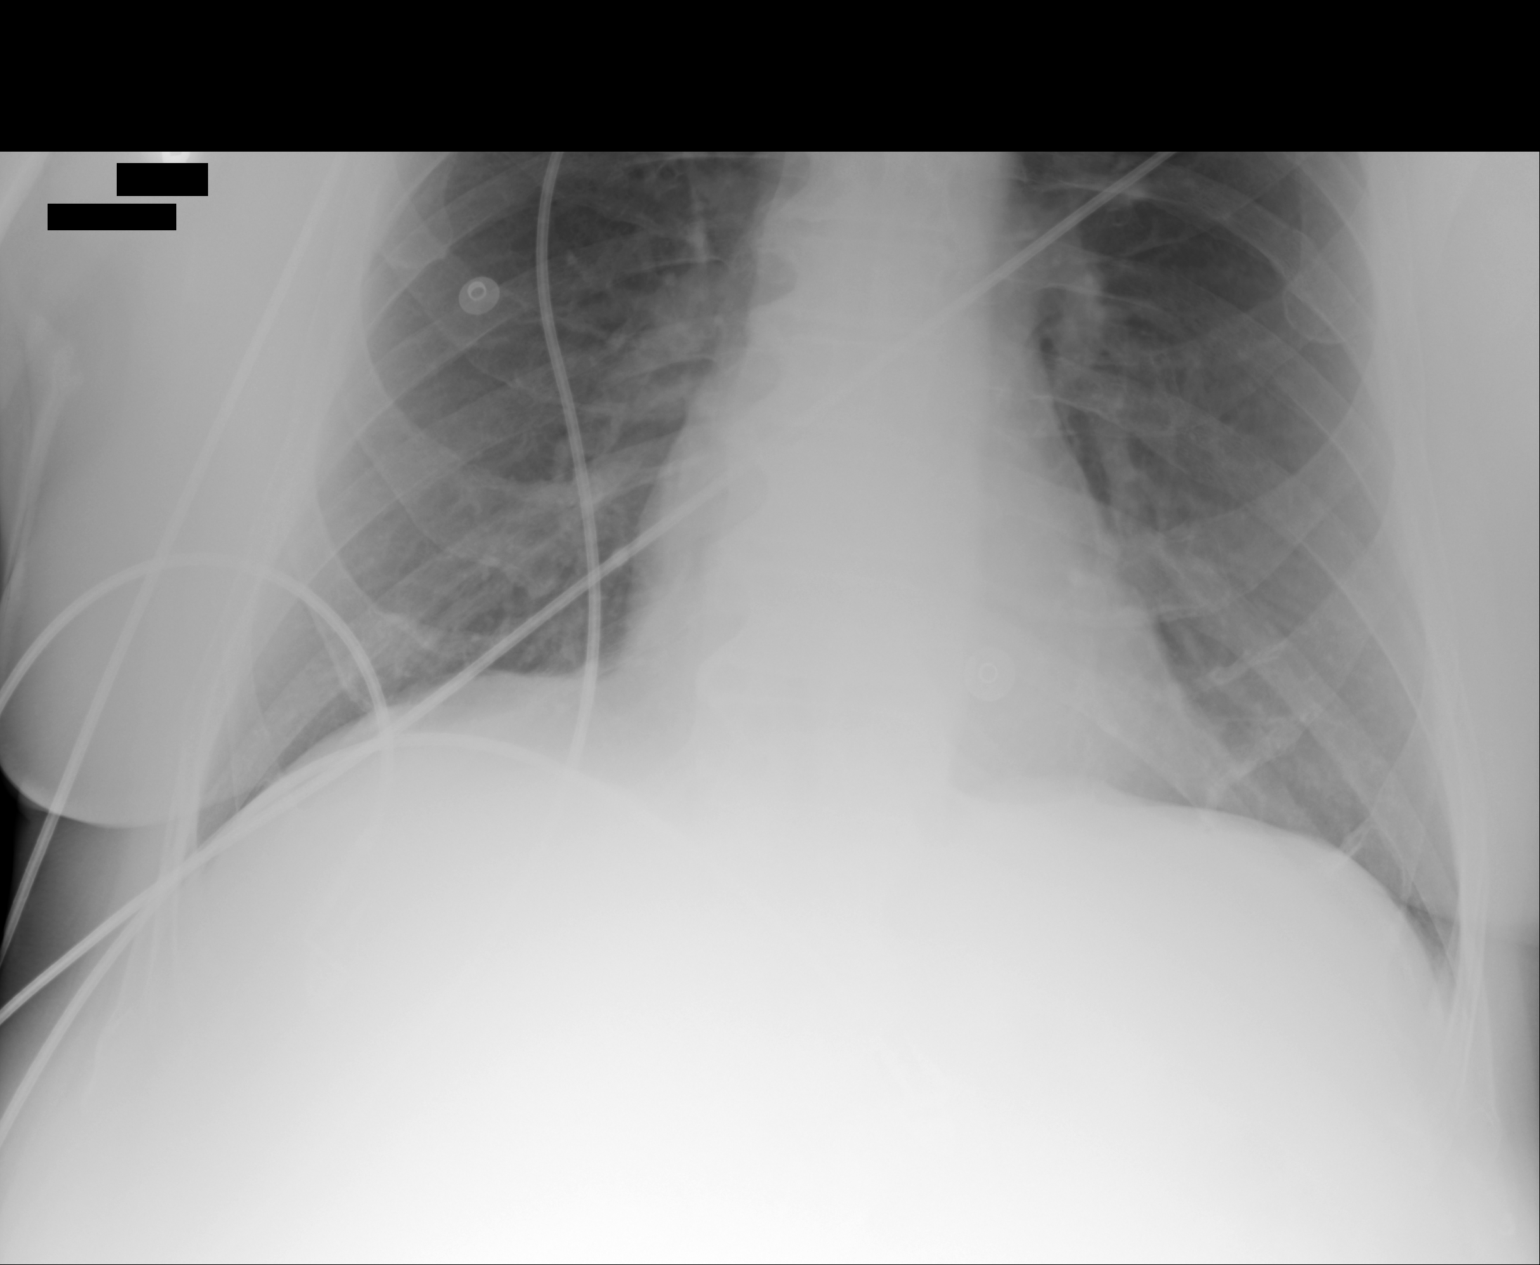

[2 of 2 positions shown; findings below may reference images not displayed]

FINDINGS: Single portable AP chest radiograph is provided.  There is no focal
parenchymal opacity, pleural effusion, or pneumothorax. Normal
cardiomediastinal silhouette. The osseous structures are unremarkable.
IMPRESSION: No acute disease of the che[REDACTED]

## 2014-12-26 IMAGING — CT CT CHEST-ABD-PELV W/ CM
1 of 2 series · 14 of 32 positions shown, 19 images · non-contrast
Comparison: none

REASON FOR EXAM: (1) initial staging colon cancer; (2) initial staging
colon cancer.
COMMENTS:   LMP: (Male)

PROCEDURE:     CT  - CT CHEST ABDOMEN AND PELVIS W  - November 24, 2012  [DATE]
RESULT:
Comparison is made to a prior study dated 12/03/2009.
TECHNIQUE: Helical 3 mm sections were obtained from the thoracic inlet
through the pubic symphysis status post intravenous administration of 100 ml
of [id number] oral contrast.

[Series 2: soft tissue · axial · 0.89mm/px · z∈[-1074,-434]mm · 14 of 243 slices shown, 19 images]
[im 15/243  mediastinal]
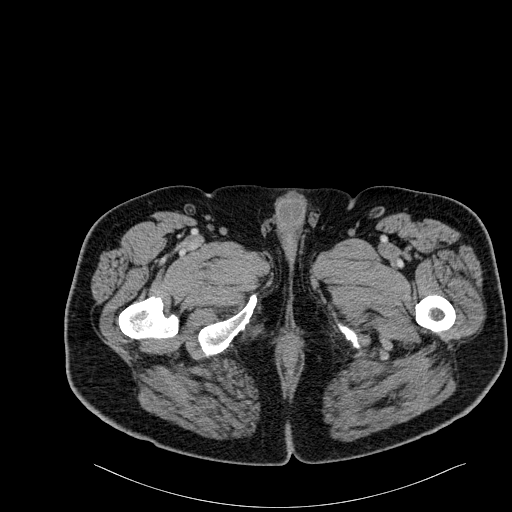
[im 15/243  bone]
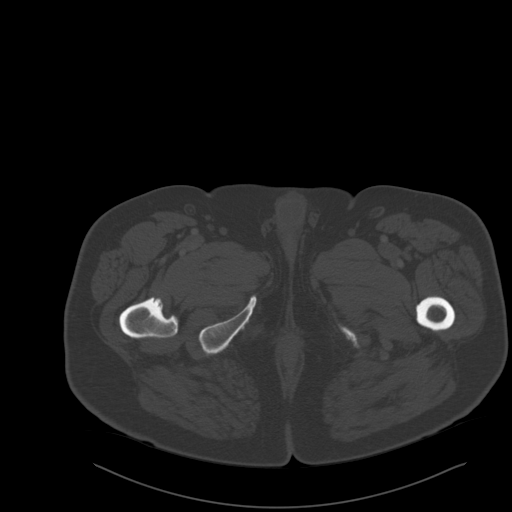
[im 43/243  mediastinal]
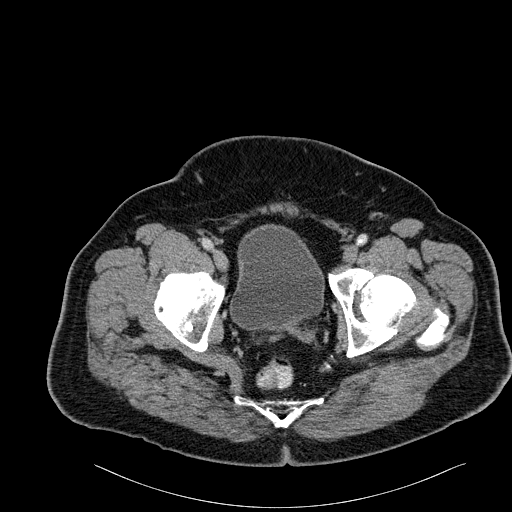
[im 57/243  mediastinal]
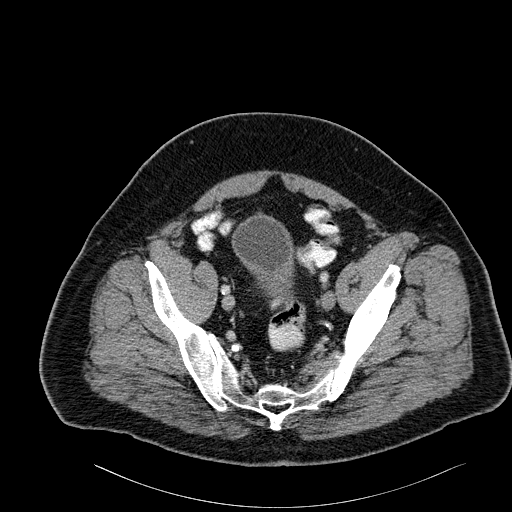
[im 81/243  mediastinal]
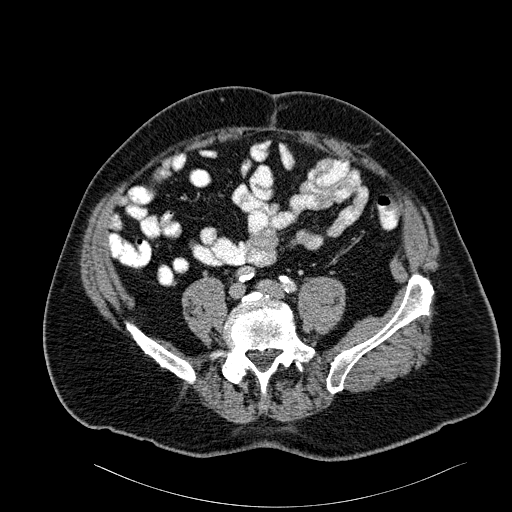
[im 86/243  mediastinal]
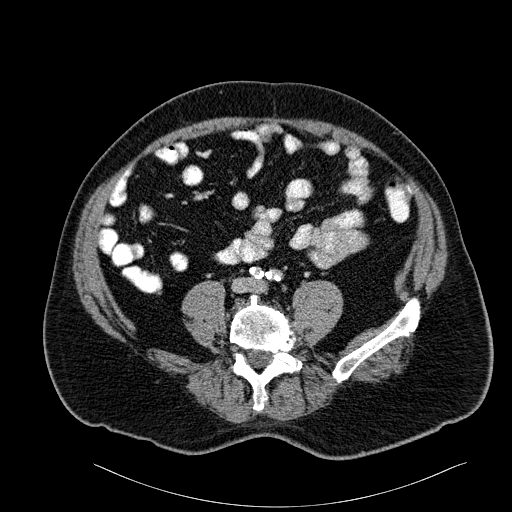
[im 114/243  mediastinal]
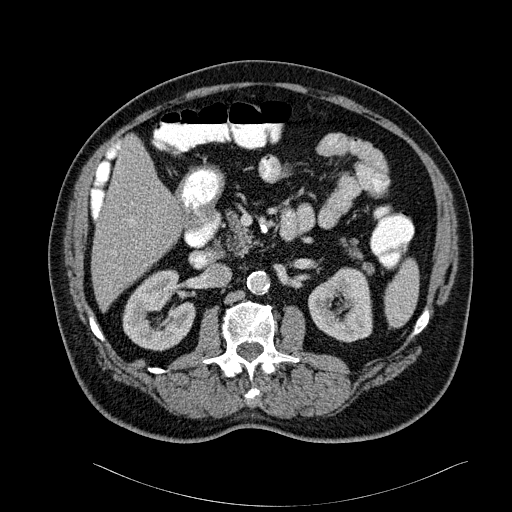
[im 119/243  mediastinal]
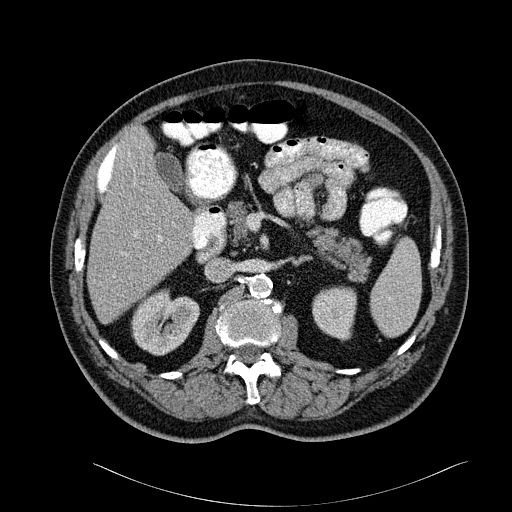
[im 129/243  mediastinal]
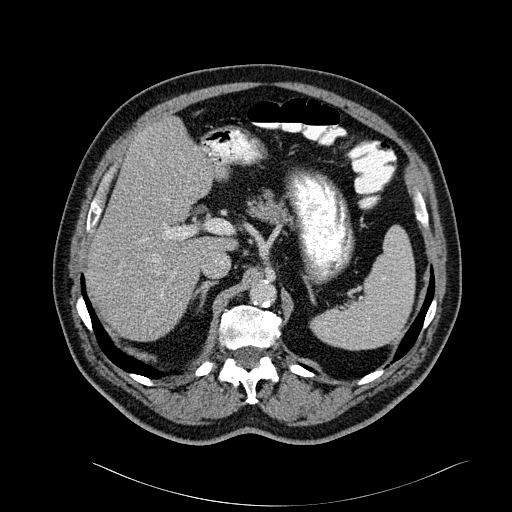
[im 157/243  mediastinal]
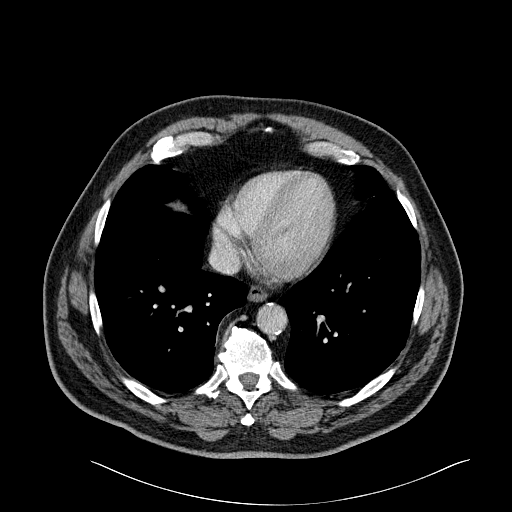
[im 157/243  bone]
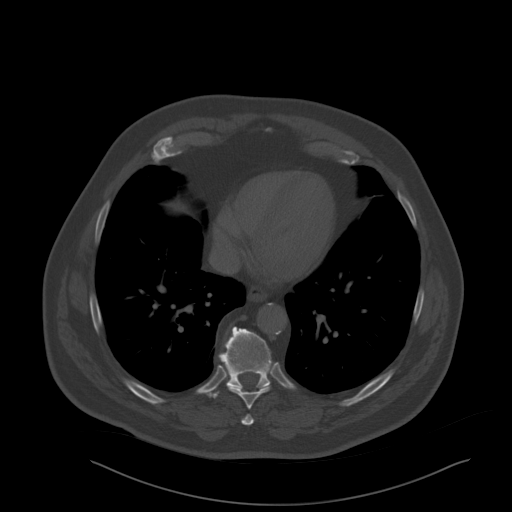
[im 162/243  mediastinal]
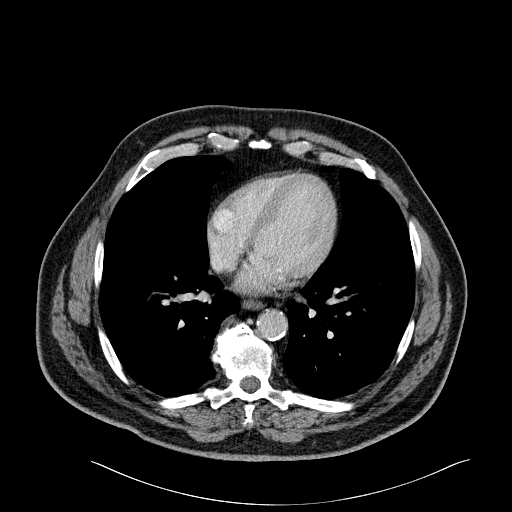
[im 186/243  mediastinal]
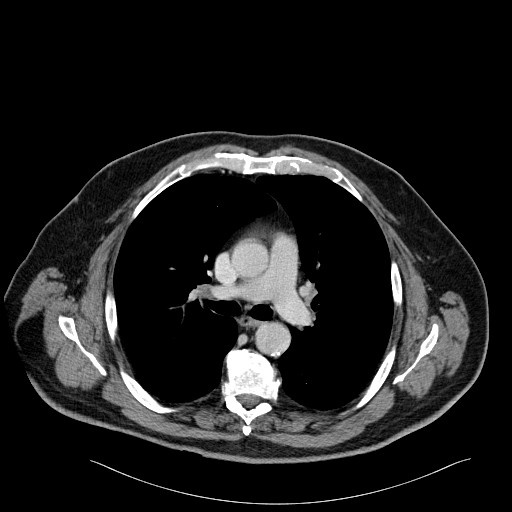
[im 186/243  lung]
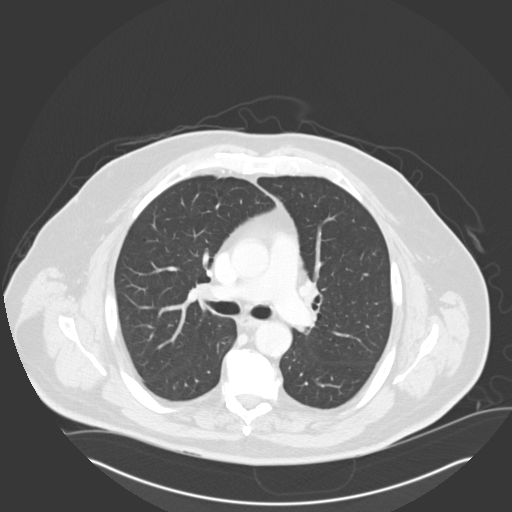
[im 200/243  mediastinal]
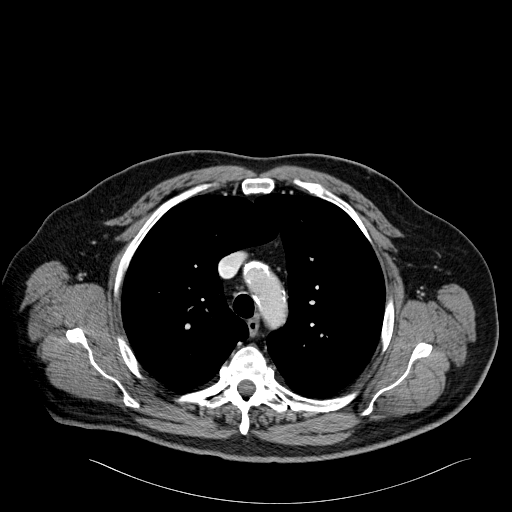
[im 200/243  lung]
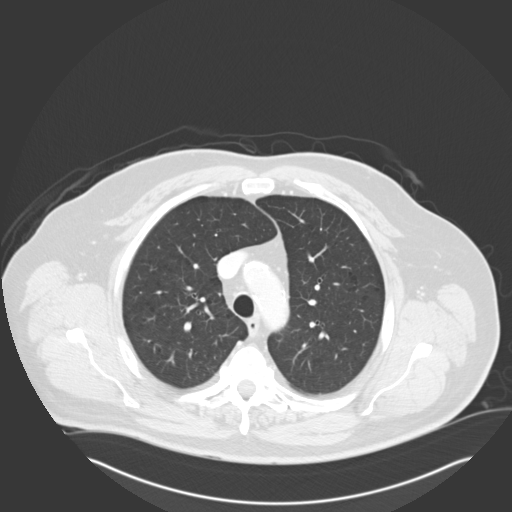
[im 214/243  lung]
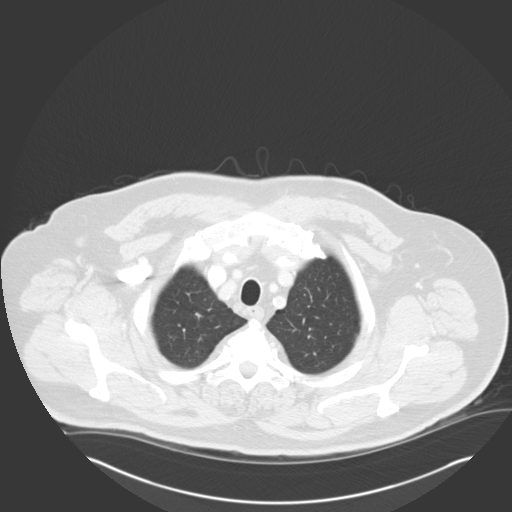
[im 228/243  mediastinal]
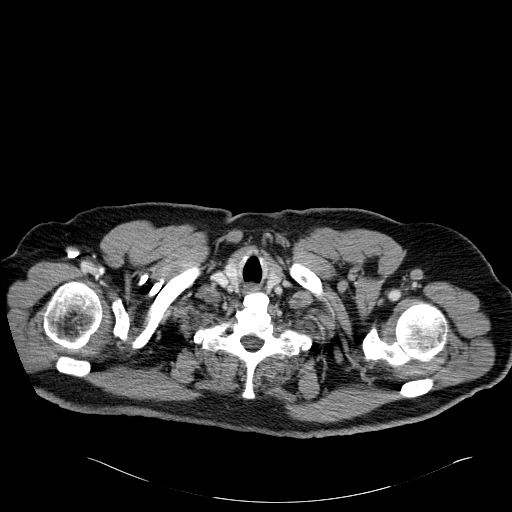
[im 228/243  lung]
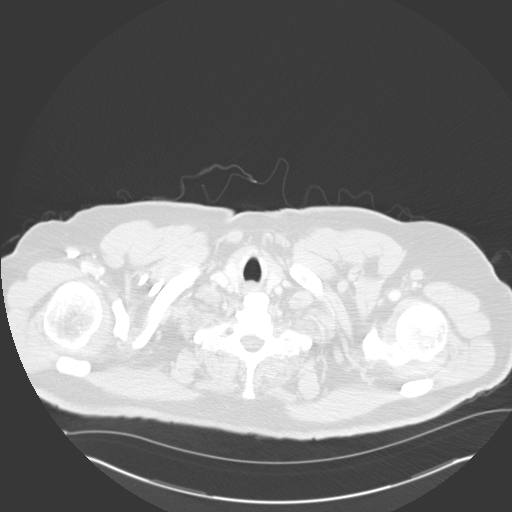

[14 of 32 positions shown; findings below may reference images not displayed]

FINDINGS: Within the posterior aspect of the right hilar region, a 1.87 x
1.66 cm lymph node is identified. With technical variability taken into
consideration, this lymph node is stable to minimally more prominent
measuring 1.83 x 1.34 cm on the previous study. Initial measurement is on
image 52 and previous measurement image 26 soft tissue. Smaller,
subcentimeter lymph nodes are identified within the mediastinum. There is no
evidence of masses.

The lung parenchyma demonstrates no focal regions of consolidation or focal
infiltrates appreciated. There is no evidence of pulmonary masses or nodules.

The liver, spleen, right adrenal, and pancreas is unremarkable. A stable,
1.2 cm nodule is identified within the left adrenal consistent with an
adenoma.

There is no evidence of bowel obstruction, enteritis, colitis or
diverticulitis. There is no evidence of an abdominal aortic aneurysm.
IMPRESSION: 1.  Mildly prominent to stable lymph node within the posterior aspect of the
mediastinum. Shotty, subcentimeter mediastinal lymph nodes.
2.  Otherwise, no evidence of regions of recurrent or active disease in the
chest, abdomen or pelvis.
3.  Stable nodule in left adrenal with characteristics consistent with an
adrenal adenoma.

## 2016-07-30 ENCOUNTER — Ambulatory Visit: Payer: Self-pay | Admitting: Urology

## 2016-07-30 ENCOUNTER — Encounter: Payer: Self-pay | Admitting: Urology

## 2017-01-14 DIAGNOSIS — I6523 Occlusion and stenosis of bilateral carotid arteries: Secondary | ICD-10-CM | POA: Insufficient documentation

## 2018-01-24 DIAGNOSIS — N401 Enlarged prostate with lower urinary tract symptoms: Secondary | ICD-10-CM | POA: Insufficient documentation

## 2024-04-18 ENCOUNTER — Other Ambulatory Visit: Payer: Self-pay

## 2024-04-18 ENCOUNTER — Inpatient Hospital Stay
Admission: EM | Admit: 2024-04-18 | Discharge: 2024-04-20 | DRG: 193 | Disposition: A | Discharge status: Home / Self Care | Attending: Obstetrics and Gynecology | Admitting: Obstetrics and Gynecology

## 2024-04-18 ENCOUNTER — Emergency Department

## 2024-04-18 DIAGNOSIS — Z1152 Encounter for screening for COVID-19: Secondary | ICD-10-CM

## 2024-04-18 DIAGNOSIS — I5A Non-ischemic myocardial injury (non-traumatic): Secondary | ICD-10-CM | POA: Diagnosis present

## 2024-04-18 DIAGNOSIS — R5381 Other malaise: Secondary | ICD-10-CM | POA: Diagnosis present

## 2024-04-18 DIAGNOSIS — Z87891 Personal history of nicotine dependence: Secondary | ICD-10-CM

## 2024-04-18 DIAGNOSIS — E669 Obesity, unspecified: Secondary | ICD-10-CM | POA: Diagnosis not present

## 2024-04-18 DIAGNOSIS — J9601 Acute respiratory failure with hypoxia: Principal | ICD-10-CM | POA: Diagnosis present

## 2024-04-18 DIAGNOSIS — D509 Iron deficiency anemia, unspecified: Secondary | ICD-10-CM | POA: Insufficient documentation

## 2024-04-18 DIAGNOSIS — R9349 Abnormal radiologic findings on diagnostic imaging of other urinary organs: Secondary | ICD-10-CM | POA: Diagnosis present

## 2024-04-18 DIAGNOSIS — D649 Anemia, unspecified: Secondary | ICD-10-CM | POA: Diagnosis not present

## 2024-04-18 DIAGNOSIS — I110 Hypertensive heart disease with heart failure: Secondary | ICD-10-CM | POA: Diagnosis present

## 2024-04-18 DIAGNOSIS — Z8249 Family history of ischemic heart disease and other diseases of the circulatory system: Secondary | ICD-10-CM

## 2024-04-18 DIAGNOSIS — E66811 Obesity, class 1: Secondary | ICD-10-CM | POA: Diagnosis present

## 2024-04-18 DIAGNOSIS — E871 Hypo-osmolality and hyponatremia: Secondary | ICD-10-CM | POA: Diagnosis present

## 2024-04-18 DIAGNOSIS — Z85038 Personal history of other malignant neoplasm of large intestine: Secondary | ICD-10-CM

## 2024-04-18 DIAGNOSIS — R0602 Shortness of breath: Secondary | ICD-10-CM | POA: Diagnosis present

## 2024-04-18 DIAGNOSIS — J439 Emphysema, unspecified: Secondary | ICD-10-CM | POA: Diagnosis present

## 2024-04-18 DIAGNOSIS — Z9221 Personal history of antineoplastic chemotherapy: Secondary | ICD-10-CM | POA: Diagnosis not present

## 2024-04-18 DIAGNOSIS — I5021 Acute systolic (congestive) heart failure: Secondary | ICD-10-CM | POA: Diagnosis present

## 2024-04-18 DIAGNOSIS — Z66 Do not resuscitate: Secondary | ICD-10-CM | POA: Diagnosis present

## 2024-04-18 DIAGNOSIS — E872 Acidosis, unspecified: Secondary | ICD-10-CM | POA: Diagnosis present

## 2024-04-18 DIAGNOSIS — E1165 Type 2 diabetes mellitus with hyperglycemia: Secondary | ICD-10-CM | POA: Diagnosis present

## 2024-04-18 DIAGNOSIS — J189 Pneumonia, unspecified organism: Secondary | ICD-10-CM | POA: Diagnosis present

## 2024-04-18 DIAGNOSIS — Z9049 Acquired absence of other specified parts of digestive tract: Secondary | ICD-10-CM

## 2024-04-18 DIAGNOSIS — J90 Pleural effusion, not elsewhere classified: Secondary | ICD-10-CM | POA: Diagnosis present

## 2024-04-18 DIAGNOSIS — E785 Hyperlipidemia, unspecified: Secondary | ICD-10-CM | POA: Diagnosis present

## 2024-04-18 DIAGNOSIS — R7989 Other specified abnormal findings of blood chemistry: Secondary | ICD-10-CM | POA: Diagnosis present

## 2024-04-18 DIAGNOSIS — E876 Hypokalemia: Secondary | ICD-10-CM | POA: Diagnosis present

## 2024-04-18 DIAGNOSIS — Z6832 Body mass index (BMI) 32.0-32.9, adult: Secondary | ICD-10-CM

## 2024-04-18 DIAGNOSIS — N400 Enlarged prostate without lower urinary tract symptoms: Secondary | ICD-10-CM | POA: Diagnosis present

## 2024-04-18 DIAGNOSIS — Z91041 Radiographic dye allergy status: Secondary | ICD-10-CM

## 2024-04-18 DIAGNOSIS — I5020 Unspecified systolic (congestive) heart failure: Secondary | ICD-10-CM | POA: Insufficient documentation

## 2024-04-18 DIAGNOSIS — I10 Essential (primary) hypertension: Secondary | ICD-10-CM | POA: Diagnosis present

## 2024-04-18 HISTORY — DX: Acute respiratory failure with hypoxia: J96.01

## 2024-04-18 HISTORY — DX: Hyperlipidemia, unspecified: E78.5

## 2024-04-18 HISTORY — DX: Essential (primary) hypertension: I10

## 2024-04-18 HISTORY — DX: Anemia, unspecified: D64.9

## 2024-04-18 HISTORY — DX: Obesity, unspecified: E66.9

## 2024-04-18 HISTORY — DX: Non-ischemic myocardial injury (non-traumatic): I5A

## 2024-04-18 HISTORY — DX: Malignant neoplasm of colon, unspecified: C18.9

## 2024-04-18 LAB — RESP PANEL BY RT-PCR (RSV, FLU A&B, COVID)  RVPGX2
Influenza A by PCR: NEGATIVE
Influenza B by PCR: NEGATIVE
Resp Syncytial Virus by PCR: NEGATIVE
SARS Coronavirus 2 by RT PCR: NEGATIVE

## 2024-04-18 LAB — CBC WITH DIFFERENTIAL/PLATELET
Abs Immature Granulocytes: 0.03 K/uL (ref 0.00–0.07)
Basophils Absolute: 0 K/uL (ref 0.0–0.1)
Basophils Relative: 0 %
Eosinophils Absolute: 0.1 K/uL (ref 0.0–0.5)
Eosinophils Relative: 1 %
HCT: 33.4 % — ABNORMAL LOW (ref 39.0–52.0)
Hemoglobin: 10.4 g/dL — ABNORMAL LOW (ref 13.0–17.0)
Immature Granulocytes: 0 %
Lymphocytes Relative: 8 %
Lymphs Abs: 0.8 K/uL (ref 0.7–4.0)
MCH: 25 pg — ABNORMAL LOW (ref 26.0–34.0)
MCHC: 31.1 g/dL (ref 30.0–36.0)
MCV: 80.3 fL (ref 80.0–100.0)
Monocytes Absolute: 0.6 K/uL (ref 0.1–1.0)
Monocytes Relative: 6 %
Neutro Abs: 8.4 K/uL — ABNORMAL HIGH (ref 1.7–7.7)
Neutrophils Relative %: 85 %
Platelets: 321 K/uL (ref 150–400)
RBC: 4.16 MIL/uL — ABNORMAL LOW (ref 4.22–5.81)
RDW: 17.1 % — ABNORMAL HIGH (ref 11.5–15.5)
WBC: 10 K/uL (ref 4.0–10.5)
nRBC: 0 % (ref 0.0–0.2)

## 2024-04-18 LAB — COMPREHENSIVE METABOLIC PANEL WITH GFR
ALT: 11 U/L (ref 0–44)
AST: 21 U/L (ref 15–41)
Albumin: 3.5 g/dL (ref 3.5–5.0)
Alkaline Phosphatase: 45 U/L (ref 38–126)
Anion gap: 10 (ref 5–15)
BUN: 6 mg/dL — ABNORMAL LOW (ref 8–23)
CO2: 23 mmol/L (ref 22–32)
Calcium: 8.9 mg/dL (ref 8.9–10.3)
Chloride: 94 mmol/L — ABNORMAL LOW (ref 98–111)
Creatinine, Ser: 0.78 mg/dL (ref 0.61–1.24)
GFR, Estimated: >60 mL/min (ref >60)
Glucose, Bld: 137 mg/dL — ABNORMAL HIGH (ref 70–99)
Potassium: 3.6 mmol/L (ref 3.5–5.1)
Sodium: 127 mmol/L — ABNORMAL LOW (ref 135–145)
Total Bilirubin: 1.2 mg/dL (ref 0.0–1.2)
Total Protein: 7.4 g/dL (ref 6.5–8.1)

## 2024-04-18 LAB — TROPONIN I (HIGH SENSITIVITY)
Troponin I (High Sensitivity): 20 ng/L — ABNORMAL HIGH (ref <18)
Troponin I (High Sensitivity): 21 ng/L — ABNORMAL HIGH (ref <18)

## 2024-04-18 LAB — BLOOD GAS, VENOUS
Acid-base deficit: 1.3 mmol/L (ref 0.0–2.0)
Bicarbonate: 24.3 mmol/L (ref 20.0–28.0)
O2 Saturation: 53.9 %
Patient temperature: 37
pCO2, Ven: 43 mmHg — ABNORMAL LOW (ref 44–60)
pH, Ven: 7.36 (ref 7.25–7.43)
pO2, Ven: 34 mmHg (ref 32–45)

## 2024-04-18 LAB — BASIC METABOLIC PANEL WITH GFR
Anion gap: 16 — ABNORMAL HIGH (ref 5–15)
BUN: 6 mg/dL — ABNORMAL LOW (ref 8–23)
CO2: 17 mmol/L — ABNORMAL LOW (ref 22–32)
Calcium: 8.9 mg/dL (ref 8.9–10.3)
Chloride: 95 mmol/L — ABNORMAL LOW (ref 98–111)
Creatinine, Ser: 0.95 mg/dL (ref 0.61–1.24)
GFR, Estimated: >60 mL/min (ref >60)
Glucose, Bld: 228 mg/dL — ABNORMAL HIGH (ref 70–99)
Potassium: 3.3 mmol/L — ABNORMAL LOW (ref 3.5–5.1)
Sodium: 128 mmol/L — ABNORMAL LOW (ref 135–145)

## 2024-04-18 LAB — LACTIC ACID, PLASMA
Lactic Acid, Venous: 3.1 mmol/L (ref 0.5–1.9)
Lactic Acid, Venous: 5.8 mmol/L (ref 0.5–1.9)

## 2024-04-18 LAB — PROTIME-INR
INR: 1.1 (ref 0.8–1.2)
Prothrombin Time: 15.1 s (ref 11.4–15.2)

## 2024-04-18 LAB — LACTATE DEHYDROGENASE: LDH: 138 U/L (ref 98–192)

## 2024-04-18 LAB — OSMOLALITY: Osmolality: 270 mosm/kg — ABNORMAL LOW (ref 275–295)

## 2024-04-18 LAB — BRAIN NATRIURETIC PEPTIDE: B Natriuretic Peptide: 592.3 pg/mL — ABNORMAL HIGH (ref 0.0–100.0)

## 2024-04-18 LAB — OSMOLALITY, URINE: Osmolality, Ur: 227 mosm/kg — ABNORMAL LOW (ref 300–900)

## 2024-04-18 LAB — EXPECTORATED SPUTUM ASSESSMENT W GRAM STAIN, RFLX TO RESP C

## 2024-04-18 LAB — SODIUM, URINE, RANDOM: Sodium, Ur: 58 mmol/L

## 2024-04-18 MED ORDER — ALBUTEROL SULFATE (2.5 MG/3ML) 0.083% IN NEBU: 3.0000 mL | INHALATION_SOLUTION | RESPIRATORY_TRACT | Status: DC | PRN

## 2024-04-18 MED ORDER — HEPARIN SODIUM (PORCINE) 5000 UNIT/ML IJ SOLN
5000.0000 [IU] | Freq: Three times a day (TID) | INTRAMUSCULAR | Status: DC
  Administered 2024-04-18 – 2024-04-20 (×6): 5000 [IU] via SUBCUTANEOUS
  Filled 2024-04-18 (×6): qty 1

## 2024-04-18 MED ORDER — HYDRALAZINE HCL 20 MG/ML IJ SOLN: 5.0000 mg | INTRAMUSCULAR | Status: DC | PRN

## 2024-04-18 MED ORDER — IPRATROPIUM-ALBUTEROL 0.5-2.5 (3) MG/3ML IN SOLN
3.0000 mL | Freq: Once | RESPIRATORY_TRACT | Status: AC
  Administered 2024-04-18: 3 mL via RESPIRATORY_TRACT
  Filled 2024-04-18: qty 3

## 2024-04-18 MED ORDER — AMLODIPINE BESYLATE 5 MG PO TABS: 5.0000 mg | ORAL_TABLET | Freq: Every day | ORAL | Status: DC

## 2024-04-18 MED ORDER — POTASSIUM CHLORIDE CRYS ER 20 MEQ PO TBCR
60.0000 meq | EXTENDED_RELEASE_TABLET | Freq: Once | ORAL | Status: AC
  Administered 2024-04-18: 60 meq via ORAL
  Filled 2024-04-18: qty 3

## 2024-04-18 MED ORDER — IPRATROPIUM-ALBUTEROL 0.5-2.5 (3) MG/3ML IN SOLN
3.0000 mL | RESPIRATORY_TRACT | Status: DC
  Administered 2024-04-18 – 2024-04-19 (×5): 3 mL via RESPIRATORY_TRACT
  Filled 2024-04-18 (×6): qty 3

## 2024-04-18 MED ORDER — VANCOMYCIN HCL 1250 MG/250ML IV SOLN
1250.0000 mg | Freq: Once | INTRAVENOUS | Status: AC
  Administered 2024-04-18: 1250 mg via INTRAVENOUS
  Filled 2024-04-18: qty 250

## 2024-04-18 MED ORDER — SODIUM CHLORIDE 1 G PO TABS
1.0000 g | ORAL_TABLET | Freq: Two times a day (BID) | ORAL | Status: DC
  Administered 2024-04-18: 1 g via ORAL
  Filled 2024-04-18: qty 1

## 2024-04-18 MED ORDER — VANCOMYCIN HCL 1000 MG/200ML IV SOLN
1000.0000 mg | Freq: Once | INTRAVENOUS | Status: AC
  Administered 2024-04-19: 1000 mg via INTRAVENOUS
  Filled 2024-04-18: qty 200

## 2024-04-18 MED ORDER — METHYLPREDNISOLONE SODIUM SUCC 125 MG IJ SOLR
125.0000 mg | Freq: Once | INTRAMUSCULAR | Status: AC
  Administered 2024-04-18: 125 mg via INTRAVENOUS
  Filled 2024-04-18: qty 2

## 2024-04-18 MED ORDER — ONDANSETRON HCL 4 MG/2ML IJ SOLN: 4.0000 mg | Freq: Three times a day (TID) | INTRAMUSCULAR | Status: DC | PRN

## 2024-04-18 MED ORDER — DM-GUAIFENESIN ER 30-600 MG PO TB12
1.0000 | ORAL_TABLET | Freq: Two times a day (BID) | ORAL | Status: DC | PRN
  Administered 2024-04-20: 1 via ORAL
  Filled 2024-04-18 (×2): qty 1

## 2024-04-18 MED ORDER — SODIUM CHLORIDE 0.9 % IV SOLN: INTRAVENOUS | Status: DC

## 2024-04-18 MED ORDER — PIPERACILLIN-TAZOBACTAM 3.375 G IVPB 30 MIN
3.3750 g | Freq: Once | INTRAVENOUS | Status: AC
  Administered 2024-04-18: 3.375 g via INTRAVENOUS
  Filled 2024-04-18: qty 50

## 2024-04-18 MED ORDER — IOHEXOL 350 MG/ML SOLN
80.0000 mL | Freq: Once | INTRAVENOUS | Status: AC | PRN
  Administered 2024-04-18: 80 mL via INTRAVENOUS

## 2024-04-18 MED ORDER — FUROSEMIDE 10 MG/ML IJ SOLN
40.0000 mg | Freq: Once | INTRAMUSCULAR | Status: AC
  Administered 2024-04-18: 40 mg via INTRAVENOUS
  Filled 2024-04-18: qty 4

## 2024-04-18 MED ORDER — ASPIRIN 81 MG PO TBEC: 81.0000 mg | DELAYED_RELEASE_TABLET | Freq: Every day | ORAL | Status: DC

## 2024-04-18 MED ORDER — ACETAMINOPHEN 325 MG PO TABS: 650.0000 mg | ORAL_TABLET | Freq: Four times a day (QID) | ORAL | Status: DC | PRN

## 2024-04-18 MED ORDER — VANCOMYCIN HCL 10 G IV SOLR: 2250.0000 mg | Freq: Once | INTRAVENOUS | Status: DC

## 2024-04-18 NOTE — Progress Notes (Signed)
 ED Pharmacy Antibiotic Sign Off An antibiotic consult was received from an ED provider for vancomycin  per pharmacy dosing for PNA. A chart review was completed to assess appropriateness.   The following one time order(s) were placed:  Vanc 2.25 g IV x 1  Further antibiotic and/or antibiotic pharmacy consults should be ordered by the admitting provider if indicated.   Thank you for allowing pharmacy to be a part of this patient's care.   Lum VEAR Mania, [facility]  Clinical Pharmacist 04/18/24 8:09 PM

## 2024-04-18 NOTE — ED Provider Notes (Signed)
 [facility] Provider Note    Event Date/Time   First MD Initiated Contact with Patient 04/18/24 1731     (approximate)   History   Shortness of Breath  Patient to ED via [facility] from home. Patient states he started feeling SOB and developed a productive cough last night. Patient does not normally wear oxygen but was 88% on room air with EMS. Patient was placed on 2L [place] with EMS and ended up getting titrated to 6L [place]. Patient has diminished lung sounds in the lower lobes.  EMS Vitals: 160/73 70 HR 97.3 94% on 6L [place]    HPI David Merritt is a 83 y.o. male.  Hypertension, hyperlipidemia, remote history of colon cancer, chronic anemia presents for evaluation of shortness of breath - Patient developed a cough last night that kept him up throughout the evening, started feeling very short of breath today - No leg pain or swelling.  No history of DVT/PE.  No recent surgery/facial/travel. - No fever  Per EMS, satting in the 80s on their arrival, placed on 2 L nasal cannula but required titration up to 6 L for saturations in the low to mid 90s       Physical Exam   Triage Vital Signs: BP (!) 147/77 (BP Location: Right Arm)   Pulse (!) 112   Temp 97.8 F (36.6 C) (Oral)   Resp 16   Ht 5' 10 (1.778 m)   Wt 102.1 kg   SpO2 94%   BMI 32.28 kg/m     Most recent vital signs: Vitals:   04/18/24 1731 04/18/24 2120  BP: (!) 147/77   Pulse: (!) 112   Resp: 16   Temp: 98.2 F (36.8 C) 97.8 F (36.6 C)  SpO2: 94%      General: Awake, no distress.  CV:  Good peripheral perfusion.  Tachycardic, regular rhythm, RP 2+.  No lower extremity edema or unilateral swelling appreciated. Resp:  Tachypneic, speaking 5 word sentences, satting about 94% on 6 L nasal cannula.  Mild and expiratory wheezing throughout, good airflow throughout, no focal coarse breath sounds appreciated. Abd:  No distention. Nontender to deep palpation throughout    ED Results /  Procedures / Treatments   Labs (all labs ordered are listed, but only abnormal results are displayed) Labs Reviewed  COMPREHENSIVE METABOLIC PANEL WITH GFR - Abnormal; Notable for the following components:      Result Value   Sodium 127 (*)    Chloride 94 (*)    Glucose, Bld 137 (*)    BUN 6 (*)    All other components within normal limits  CBC WITH DIFFERENTIAL/PLATELET - Abnormal; Notable for the following components:   RBC 4.16 (*)    Hemoglobin 10.4 (*)    HCT 33.4 (*)    MCH 25.0 (*)    RDW 17.1 (*)    Neutro Abs 8.4 (*)    All other components within normal limits  BRAIN NATRIURETIC PEPTIDE - Abnormal; Notable for the following components:   B Natriuretic Peptide 592.3 (*)    All other components within normal limits  BLOOD GAS, VENOUS - Abnormal; Notable for the following components:   pCO2, Ven 43 (*)    All other components within normal limits  LACTIC ACID, PLASMA - Abnormal; Notable for the following components:   Lactic Acid, Venous 3.1 (*)    All other components within normal limits  LACTIC ACID, PLASMA - Abnormal; Notable for the following components:  Lactic Acid, Venous 5.8 (*)    All other components within normal limits  OSMOLALITY - Abnormal; Notable for the following components:   Osmolality 270 (*)    All other components within normal limits  OSMOLALITY, URINE - Abnormal; Notable for the following components:   Osmolality, Ur 227 (*)    All other components within normal limits  BASIC METABOLIC PANEL WITH GFR - Abnormal; Notable for the following components:   Sodium 128 (*)    Potassium 3.3 (*)    Chloride 95 (*)    CO2 17 (*)    Glucose, Bld 228 (*)    BUN 6 (*)    Anion gap 16 (*)    All other components within normal limits  TROPONIN I (HIGH SENSITIVITY) - Abnormal; Notable for the following components:   Troponin I (High Sensitivity) 20 (*)    All other components within normal limits  TROPONIN I (HIGH SENSITIVITY) - Abnormal; Notable for  the following components:   Troponin I (High Sensitivity) 21 (*)    All other components within normal limits  RESP PANEL BY RT-PCR (RSV, FLU A&B, COVID)  [id number]  EXPECTORATED SPUTUM ASSESSMENT W GRAM STAIN, RFLX TO RESP C  CULTURE, BLOOD (ROUTINE X 2)  CULTURE, BLOOD (ROUTINE X 2)  CULTURE, RESPIRATORY W GRAM STAIN  SODIUM, URINE, RANDOM  LACTATE DEHYDROGENASE  PROTIME-INR  BASIC METABOLIC PANEL WITH GFR  STREP PNEUMONIAE URINARY ANTIGEN  LEGIONELLA PNEUMOPHILA SEROGP 1 UR AG  CBC  APTT  PROCALCITONIN  MAGNESIUM  BASIC METABOLIC PANEL WITH GFR  BASIC METABOLIC PANEL WITH GFR  LIPID PANEL  VITAMIN B12  FOLATE  IRON  AND TIBC  FERRITIN  RETICULOCYTES  LACTIC ACID, PLASMA  LACTIC ACID, PLASMA  LACTIC ACID, PLASMA     EKG  Ecg = sinus rhythm, rate 114, no gross ST elevation, some diffuse depressions and T wave inversions noted in inferior and lateral leads.  Normal axis, normal intervals.  Abnormal EKG.  Possible supraventricular bigeminy.  Consider rate dependent changes.   RADIOLOGY Radiology interpreted by myself and radiology report reviewed.  Notable for bilateral pneumonia as well as pleural effusions.   PROCEDURES:  Critical Care performed: Yes, see critical care procedure note(s)  .Critical Care  Performed by: Clarine Ozell LABOR, MD Authorized by: Clarine Ozell LABOR, MD   Critical care provider statement:    Critical care time (minutes):  30   Critical care time was exclusive of:  Separately billable procedures and treating other patients   Critical care was necessary to treat or prevent imminent or life-threatening deterioration of the following conditions:  Respiratory failure   Critical care was time spent personally by me on the following activities:  Development of treatment plan with patient or surrogate, discussions with consultants, evaluation of patient's response to treatment, examination of patient, ordering and review of laboratory studies, ordering and  review of radiographic studies, ordering and performing treatments and interventions, pulse oximetry, re-evaluation of patient's condition and review of old charts   I assumed direction of critical care for this patient from another provider in my specialty: no     Care discussed with: admitting provider      MEDICATIONS ORDERED IN ED: Medications  vancomycin  (VANCOREADY) IVPB 1250 mg/250 mL (0 mg Intravenous Stopped 04/18/24 2332)    And  vancomycin  (VANCOREADY) IVPB 1000 mg/200 mL (1,000 mg Intravenous New Bag/Given 04/19/24 0000)  sodium chloride  tablet 1 g (1 g Oral Given 04/18/24 2055)  ipratropium-albuterol  (DUONEB) 0.5-2.5 (3) MG/3ML  nebulizer solution 3 mL (3 mLs Nebulization Given 04/18/24 2056)  albuterol  (PROVENTIL ) (2.5 MG/3ML) 0.083% nebulizer solution 3 mL (has no administration in time range)  dextromethorphan -guaiFENesin  (MUCINEX  DM) 30-600 MG per 12 hr tablet 1 tablet (has no administration in time range)  ondansetron  (ZOFRAN ) injection 4 mg (has no administration in time range)  hydrALAZINE  (APRESOLINE ) injection 5 mg (has no administration in time range)  acetaminophen  (TYLENOL ) tablet 650 mg (has no administration in time range)  heparin  injection 5,000 Units (5,000 Units Subcutaneous Given 04/18/24 2121)  amLODipine  (NORVASC ) tablet 5 mg (has no administration in time range)  ipratropium-albuterol  (DUONEB) 0.5-2.5 (3) MG/3ML nebulizer solution 3 mL (3 mLs Nebulization Given 04/18/24 1753)  ipratropium-albuterol  (DUONEB) 0.5-2.5 (3) MG/3ML nebulizer solution 3 mL (3 mLs Nebulization Given 04/18/24 1753)  methylPREDNISolone  sodium succinate (SOLU-MEDROL ) 125 mg/2 mL injection 125 mg (125 mg Intravenous Given 04/18/24 1807)  iohexol  (OMNIPAQUE ) 350 MG/ML injection 80 mL (80 mLs Intravenous Contrast Given 04/18/24 1842)  piperacillin -tazobactam (ZOSYN ) IVPB 3.375 g (0 g Intravenous Stopped 04/18/24 2130)  potassium chloride  SA (KLOR-CON  M) CR tablet 60 mEq (60 mEq Oral Given 04/18/24  2359)  furosemide  (LASIX ) injection 40 mg (40 mg Intravenous Given 04/18/24 2359)     IMPRESSION / MDM / ASSESSMENT AND PLAN / ED COURSE  I reviewed the triage vital signs and the nursing notes.                              DDX/MDM/AP: Differential diagnosis includes, but is not limited to, viral syndrome including influenza or COVID-19, reactive airway disease/asthma/COPD, consider heart failure, pneumonia, ACS, pulmonary embolism, pneumothorax, CHF.   Plan: - Labs - EKG - Supplemental oxygen - DuoNeb, steroids - Chest x-ray - Cardiac monitor - CT PE -Reassess  Patient's presentation is most consistent with acute presentation with potential threat to life or bodily function.  The patient is on the cardiac monitor to evaluate for evidence of arrhythmia and/or significant heart rate changes.  ED course below.  Workup notable for bilateral pneumonia, treated with Vanco, Zosyn .  Avoided aggressive fluid resuscitation in the setting of possible underlying heart failure with elevated BNP here and notable hypoxia.  No underlying pulmonary embolism pulmonary embolism.  Clinical Course as of 04/19/24 0002  Tue Apr 18, 2024  1816 CBC with no leukocytosis, hemoglobin 10.4  VBG reassuring [MM]  1840 CMP with hyponatremia, otherwise unremarkable [MM]  1840 Troponin mildly elevated at 20, suspect type II NSTEMI, will trend [MM]  1840 CXR: IMPRESSION: 1. Mildly increased interstitial lung markings which are likely, in part, chronic in nature. A mild component interstitial edema cannot be excluded. 2. Mild bibasilar atelectasis and/or infiltrate.   [MM]  1853 Viral swab neg [MM]  2004 CTA chest: IMPRESSION: No evidence of pulmonary emboli.  Bilateral lower lobe infiltrates with associated moderate effusions. Additionally left upper lobe infiltrate is seen.  Fullness of the collecting systems bilaterally slightly worse on the left than the right. CT urogram may be helpful for  further evaluation.   [MM]  2011 Hospitalist consult order placed [MM]    Clinical Course User Index [MM] Clarine Ozell LABOR, MD     FINAL CLINICAL IMPRESSION(S) / ED DIAGNOSES   Final diagnoses:  Acute respiratory failure with hypoxia (HCC)  Pneumonia of both lungs due to infectious organism, unspecified part of lung     Rx / DC Orders   ED Discharge Orders     None  Note:  This document was prepared using Dragon voice recognition software and may include unintentional dictation errors.   Clarine Ozell LABOR, MD 04/19/24 [phone]

## 2024-04-18 NOTE — H&P (Signed)
 History and Physical    David Merritt FMW:[id number] DOB: 1941/03/29 DOA: 04/18/2024  Referring MD/NP/PA:   PCP: Pcp, No   Patient coming from:  The patient is coming from home.     Chief Complaint: Cough, SOB  HPI: David Merritt is a 83 y.o. male with medical history significant of HTN, HLD, pre-DM, GERD, depression, former smoker, anemia, obesity, colon cancer s/p of partial colectomy and chemotherapy), who presents with cough, SOB.  Patient states that his symptoms started last night, including cough with little mucus production and SOB which has been progressively worsening.  Patient also has chest pressure.  No fever or chills.  Patient has loose stool, no active diarrhea, nausea, vomiting, abdominal pain.  No symptoms of UTI. Patient normally does not use oxygen, was found to have moderate acute respiratory distress, oxygen desaturation to 88% on room air, which improved to 94% on 6 L oxygen.  Data reviewed independently and ED Course: pt was found to have WBC 10.0, lactic acid 3.1--> 5.8, troponin 20 --> 21, BNP 592, sodium 127, GFR> 60.  Temperature normal, blood pressure 147/77, heart rate 112, 16.  Patient is admitted to PCU as inpatient.  Chest x-ray: 1. Mildly increased interstitial lung markings which are likely, in part, chronic in nature. A mild component interstitial edema cannot be excluded. 2. Mild bibasilar atelectasis and/or infiltrate.  CTA: No evidence of pulmonary emboli.   Bilateral lower lobe infiltrates with associated moderate effusions. Additionally left upper lobe infiltrate is seen.   Fullness of the collecting systems bilaterally slightly worse on the left than the right. CT urogram may be helpful for further evaluation.   Aortic Atherosclerosis (ICD10-I70.0) and Emphysema (ICD10-J43.9).   EKG: I have personally reviewed.  Sinus rhythm, QTc 431, anteroseptal infarction pattern, T wave inversion in inferior leads, V5-V6, PAC   Review of  Systems:   General: no fevers, chills, no body weight gain, has poor appetite, has fatigue HEENT: no blurry vision, hearing changes or sore throat Respiratory: has dyspnea, coughing CV: has chest pressure,  no palpitations GI: no nausea, vomiting, abdominal pain, diarrhea, constipation GU: no dysuria, burning on urination, increased urinary frequency, hematuria  Ext: has  trace leg edema Neuro: no unilateral weakness, numbness, or tingling, no vision change or hearing loss Skin: no rash, no skin tear. MSK: No muscle spasm, no deformity, no limitation of range of movement in spin Heme: No easy bruising.  Travel history: No recent long distant travel.   Allergy:  Allergies  Allergen Reactions   Iodinated Contrast Media Other (See Comments)    Pt unsure if this is correct    Past Medical History:  Diagnosis Date   Colon cancer (HCC)    HLD (hyperlipidemia)    Hypertension    Normocytic anemia    Obesity (BMI 30-39.9)     Past Surgical History:  Procedure Laterality Date   Partial colectomy due to colon cancer      Social History:  reports that he has quit smoking. His smoking use included cigarettes and pipe. He has never used smokeless tobacco. He reports current alcohol use of about 14.0 standard drinks of alcohol per week. He reports that he does not use drugs.  Family History:  Family History  Problem Relation Age of Onset   CAD Sister    Heart disease Sister      Prior to Admission medications   Not on File    Physical Exam: Vitals:   04/18/24 1731 04/18/24 1732  04/18/24 2120 04/19/24 0105  BP: (!) 147/77     Pulse: (!) 112     Resp: 16     Temp: 98.2 F (36.8 C)  97.8 F (36.6 C) 97.8 F (36.6 C)  TempSrc: Oral  Oral Oral  SpO2: 94%     Weight:  102.1 kg    Height:  5' 10 (1.778 m)     General: Has moderate acute respiratory distress HEENT:       Eyes: PERRL, EOMI, no jaundice       ENT: No discharge from the ears and nose, no pharynx injection,  no tonsillar enlargement.        Neck: Difficult to assess JVD due to obesity, no bruit, no mass felt. Heme: No neck lymph node enlargement. Cardiac: S1/S2, RRR, No murmurs, No gallops or rubs. Respiratory: Crackles bilaterally GI: Soft, nondistended, nontender, no rebound pain, no organomegaly, BS present. GU: No hematuria Ext: has trace leg edema bilaterally. 1+DP/PT pulse bilaterally. Musculoskeletal: No joint deformities, No joint redness or warmth, no limitation of ROM in spin. Skin: No rashes.  Neuro: Alert, oriented X3, cranial nerves II-XII grossly intact, moves all extremities normally.  Psych: Patient is not psychotic, no suicidal or hemocidal ideation.  Labs on Admission: I have personally reviewed following labs and imaging studies  CBC: Recent Labs  Lab 04/18/24 1739  WBC 10.0  NEUTROABS 8.4*  HGB 10.4*  HCT 33.4*  MCV 80.3  PLT 321   Basic Metabolic Panel: Recent Labs  Lab 04/18/24 1739 04/18/24 2125  NA 127* 128*  K 3.6 3.3*  CL 94* 95*  CO2 23 17*  GLUCOSE 137* 228*  BUN 6* 6*  CREATININE 0.78 0.95  CALCIUM 8.9 8.9  MG  --  1.8   GFR: Estimated Creatinine Clearance: 70.5 mL/min (by C-G formula based on SCr of 0.95 mg/dL). Liver Function Tests: Recent Labs  Lab 04/18/24 1739  AST 21  ALT 11  ALKPHOS 45  BILITOT 1.2  PROT 7.4  ALBUMIN 3.5   No results for input(s): LIPASE, AMYLASE in the last 168 hours. No results for input(s): AMMONIA in the last 168 hours. Coagulation Profile: Recent Labs  Lab 04/18/24 1739  INR 1.1   Cardiac Enzymes: No results for input(s): CKTOTAL, CKMB, CKMBINDEX, TROPONINI in the last 168 hours. BNP (last 3 results) No results for input(s): PROBNP in the last 8760 hours. HbA1C: No results for input(s): HGBA1C in the last 72 hours. CBG: No results for input(s): GLUCAP in the last 168 hours. Lipid Profile: No results for input(s): CHOL, HDL, LDLCALC, TRIG, CHOLHDL, LDLDIRECT in  the last 72 hours. Thyroid Function Tests: No results for input(s): TSH, T4TOTAL, FREET4, T3FREE, THYROIDAB in the last 72 hours. Anemia Panel: Recent Labs    04/18/24 2125  FOLATE 17.3  FERRITIN 13*  TIBC 503*  IRON  54   Urine analysis:    Component Value Date/Time   COLORURINE Yellow 09/08/2013 1158   APPEARANCEUR Hazy 09/08/2013 1158   LABSPEC 1.010 09/08/2013 1158   PHURINE 6.0 09/08/2013 1158   GLUCOSEU Negative 09/08/2013 1158   HGBUR 1+ 09/08/2013 1158   BILIRUBINUR Negative 09/08/2013 1158   KETONESUR Negative 09/08/2013 1158   PROTEINUR Negative 09/08/2013 1158   NITRITE Negative 09/08/2013 1158   LEUKOCYTESUR Trace 09/08/2013 1158   Sepsis Labs: @LABRCNTIP (procalcitonin:4,lacticidven:4) ) Recent Results (from the past 240 hours)  Resp panel by RT-PCR (RSV, Flu A&B, Covid) Anterior Nasal Swab     Status: None   Collection Time:  04/18/24  5:39 PM   Specimen: Anterior Nasal Swab  Result Value Ref Range Status   SARS Coronavirus 2 by RT PCR NEGATIVE NEGATIVE Final    Comment: (NOTE) SARS-CoV-2 target nucleic acids are NOT DETECTED.  The SARS-CoV-2 RNA is generally detectable in upper respiratory specimens during the acute phase of infection. The lowest concentration of SARS-CoV-2 viral copies this assay can detect is 138 copies/mL. A negative result does not preclude SARS-Cov-2 infection and should not be used as the sole basis for treatment or other patient management decisions. A negative result may occur with  improper specimen collection/handling, submission of specimen other than nasopharyngeal swab, presence of viral mutation(s) within the areas targeted by this assay, and inadequate number of viral copies(<138 copies/mL). A negative result must be combined with clinical observations, patient history, and epidemiological information. The expected result is Negative.  Fact Sheet for Patients:  BloggerCourse.com  Fact  Sheet for Healthcare Providers:  SeriousBroker.it  This test is no t yet approved or cleared by the [place]  FDA and  has been authorized for detection and/or diagnosis of SARS-CoV-2 by FDA under an Emergency Use Authorization (EUA). This EUA will remain  in effect (meaning this test can be used) for the duration of the COVID-19 declaration under Section 564(b)(1) of the Act, 21 U.S.C.section 360bbb-3(b)(1), unless the authorization is terminated  or revoked sooner.       Influenza A by PCR NEGATIVE NEGATIVE Final   Influenza B by PCR NEGATIVE NEGATIVE Final    Comment: (NOTE) The Xpert Xpress SARS-CoV-2/FLU/RSV plus assay is intended as an aid in the diagnosis of influenza from Nasopharyngeal swab specimens and should not be used as a sole basis for treatment. Nasal washings and aspirates are unacceptable for Xpert Xpress SARS-CoV-2/FLU/RSV testing.  Fact Sheet for Patients: BloggerCourse.com  Fact Sheet for Healthcare Providers: SeriousBroker.it  This test is not yet approved or cleared by the [place]  FDA and has been authorized for detection and/or diagnosis of SARS-CoV-2 by FDA under an Emergency Use Authorization (EUA). This EUA will remain in effect (meaning this test can be used) for the duration of the COVID-19 declaration under Section 564(b)(1) of the Act, 21 U.S.C. section 360bbb-3(b)(1), unless the authorization is terminated or revoked.     Resp Syncytial Virus by PCR NEGATIVE NEGATIVE Final    Comment: (NOTE) Fact Sheet for Patients: BloggerCourse.com  Fact Sheet for Healthcare Providers: SeriousBroker.it  This test is not yet approved or cleared by the [place]  FDA and has been authorized for detection and/or diagnosis of SARS-CoV-2 by FDA under an Emergency Use Authorization (EUA). This EUA will remain in effect  (meaning this test can be used) for the duration of the COVID-19 declaration under Section 564(b)(1) of the Act, 21 U.S.C. section 360bbb-3(b)(1), unless the authorization is terminated or revoked.  Performed at [facility], [address] [place], [place] [address]   Expectorated Sputum Assessment w Gram Stain, Rflx to Resp Cult     Status: None   Collection Time: 04/18/24  8:59 PM   Specimen: Sputum  Result Value Ref Range Status   Specimen Description SPUTUM  Final   Special Requests NONE  Final   Sputum evaluation   Final    THIS SPECIMEN IS ACCEPTABLE FOR SPUTUM CULTURE Performed at [facility], [address] [place], [place] [address]    Report Status 04/18/2024 FINAL  Final     Radiological Exams on Admission:   Assessment/Plan Principal Problem:  Acute respiratory failure with hypoxia (HCC) Active Problems:   Bilateral pleural effusion   Myocardial injury   Elevated lactic acid level   Hyponatremia   Hypokalemia   Hypertension   HLD (hyperlipidemia)   Normocytic anemia   Obesity (BMI 30-39.9)   Assessment and Plan:  Acute respiratory failure with hypoxia [facility]): Etiology is not clear.  CTA negative for PE, but showed bilateral lower lobe infiltrates with associated moderate effusions, with additionally left upper lobe infiltrate.  Patient also has a significantly elevated lactic acid level 3.1 --> 5.8.  Due to these findings, pneumonia is suspected by EDP, vancomycin  and Zosyn  were given in the ED.  However patient does not have fever or leukocytosis.  I checked BNP which is elevated 592, acute CHF is a potential differential diagnosis.  Patient has hyponatremia with sodium 127, limiting aggressive IV diuretics use.    -will admit to PCU as inpt -will give 40 mg of IV lasix  --> monitor sodium level carefully as below - Patient was given vancomycin  and Zosyn  in ED. Will check procalcitonin level to decide if antibiotics need to be  continued. - Follow-up procalcitonin level - Follow-up sputum culture, blood culture - Bronchodilators as needed Mucinex  - Nasal cannula oxygen to maintain oxygen saturation above 93% - Get 2D echo  Bilateral pleural effusion: Patient has oderate bilateral pleural effusion. Since CHF is expected, will not order thoracentesis now.  Will watch if patient responds to diuresis. -IV Lasix  is ordered.  Myocardial injury: Troponin 20 --> 21.  Likely demand ischemia. -Aspirin  81 mg - Follow-up 2D echo  Elevated lactic acid level: Lactic acid 3.1 --> 5.8.  No fever or leukocytosis, clinically does not seem to have sepsis.  Possibly due to hypoxia. -Trend lactic acid level - Discontinue IV fluid.  Hyponatremia: Sodium 127.  - Will check urine sodium, urine osmolality, serum osmolality. - Fluid restriction - Sodium chloride  tablet 1 g twice daily - f/u by BMP q8h - avoid over correction too fast due to risk of central pontine myelinolysis  Hypokalemia: K 3.3. - Repleted potassium - Check magnesium level  Hypertension -IV hydralazine  as needed - Continue home amlodipine   HLD (hyperlipidemia): Patient is not taking medications. -Check FLP  Normocytic anemia: Hgb 10.0 (13.4 on 01/06/17) -check anemia panel  Obesity (BMI 30-39.9): Patient has Obesity Class I, with body weight 102.1 Kg and BMI 32.28 kg/m2.  - Encourage losing weight - Exercise and healthy diet  Abnormal findings of CTA: CTA showed fullness of the collecting systems bilaterally slightly worse on the left than the right.  -need CT urogram as outpt when current acute issue resolves.         DVT ppx: SQ Heparin      Code Status: Full code   Family Communication:     not done, no family member is at bed side.       Disposition Plan:  Anticipate discharge back to previous environment  Consults called:  none  Admission status and Level of care: Progressive:  as inpt        Dispo: The patient is from: Home               Anticipated d/c is to: Home              Anticipated d/c date is: 2 days              Patient currently is not medically stable to d/c.    Severity of Illness:  The  appropriate patient status for this patient is INPATIENT. Inpatient status is judged to be reasonable and necessary in order to provide the required intensity of service to ensure the patient's safety. The patient's presenting symptoms, physical exam findings, and initial radiographic and laboratory data in the context of their chronic comorbidities is felt to place them at high risk for further clinical deterioration. Furthermore, it is not anticipated that the patient will be medically stable for discharge from the hospital within 2 midnights of admission.   * I certify that at the point of admission it is my clinical judgment that the patient will require inpatient hospital care spanning beyond 2 midnights from the point of admission due to high intensity of service, high risk for further deterioration and high frequency of surveillance required.*       Date of Service 04/19/2024    Caleb Exon [facility]   If 7PM-7AM, please contact night-coverage [email or web address] 04/19/2024, 2:09 AM

## 2024-04-18 NOTE — ED Triage Notes (Signed)
 Patient to ED via [facility] from home. Patient states he started feeling SOB and developed a productive cough last night. Patient does not normally wear oxygen but was 88% on room air with EMS. Patient was placed on 2L [place] with EMS and ended up getting titrated to 6L [place]. Patient has diminished lung sounds in the lower lobes.  EMS Vitals: 160/73 70 HR 97.3 94% on 6L [place]

## 2024-04-18 NOTE — ED Notes (Signed)
 Date and time results received: 04/18/24 2129  Test: Lactic Acid Critical Value: 3.1  Name of Provider Notified: MD Hilma

## 2024-04-18 NOTE — ED Notes (Signed)
 CCMD called to admit patient to monitor

## 2024-04-19 ENCOUNTER — Inpatient Hospital Stay
Admit: 2024-04-19 | Discharge: 2024-04-19 | Disposition: A | Discharge status: Home / Self Care | Attending: Internal Medicine | Admitting: Internal Medicine

## 2024-04-19 ENCOUNTER — Inpatient Hospital Stay

## 2024-04-19 DIAGNOSIS — J9601 Acute respiratory failure with hypoxia: Secondary | ICD-10-CM

## 2024-04-19 LAB — BASIC METABOLIC PANEL WITH GFR
Anion gap: 14 (ref 5–15)
Anion gap: 7 (ref 5–15)
BUN: 9 mg/dL (ref 8–23)
BUN: 10 mg/dL (ref 8–23)
CO2: 19 mmol/L — ABNORMAL LOW (ref 22–32)
CO2: 17 mmol/L — ABNORMAL LOW (ref 22–32)
Calcium: 9 mg/dL (ref 8.9–10.3)
Calcium: 7.2 mg/dL — ABNORMAL LOW (ref 8.9–10.3)
Chloride: 97 mmol/L — ABNORMAL LOW (ref 98–111)
Chloride: 112 mmol/L — ABNORMAL HIGH (ref 98–111)
Creatinine, Ser: 0.98 mg/dL (ref 0.61–1.24)
Creatinine, Ser: 0.79 mg/dL (ref 0.61–1.24)
GFR, Estimated: >60 mL/min (ref >60)
GFR, Estimated: >60 mL/min (ref >60)
Glucose, Bld: 216 mg/dL — ABNORMAL HIGH (ref 70–99)
Glucose, Bld: 136 mg/dL — ABNORMAL HIGH (ref 70–99)
Potassium: 4.1 mmol/L (ref 3.5–5.1)
Potassium: 3.3 mmol/L — ABNORMAL LOW (ref 3.5–5.1)
Sodium: 130 mmol/L — ABNORMAL LOW (ref 135–145)
Sodium: 136 mmol/L (ref 135–145)

## 2024-04-19 LAB — CBC
HCT: 32.7 % — ABNORMAL LOW (ref 39.0–52.0)
Hemoglobin: 10.2 g/dL — ABNORMAL LOW (ref 13.0–17.0)
MCH: 25.3 pg — ABNORMAL LOW (ref 26.0–34.0)
MCHC: 31.2 g/dL (ref 30.0–36.0)
MCV: 81.1 fL (ref 80.0–100.0)
Platelets: 310 K/uL (ref 150–400)
RBC: 4.03 MIL/uL — ABNORMAL LOW (ref 4.22–5.81)
RDW: 17.2 % — ABNORMAL HIGH (ref 11.5–15.5)
WBC: 6.7 K/uL (ref 4.0–10.5)
nRBC: 0 % (ref 0.0–0.2)

## 2024-04-19 LAB — RESPIRATORY PANEL BY PCR

## 2024-04-19 LAB — RETICULOCYTES
Immature Retic Fract: 25 % — ABNORMAL HIGH (ref 2.3–15.9)
RBC.: 4.04 MIL/uL — ABNORMAL LOW (ref 4.22–5.81)
Retic Count, Absolute: 72.7 K/uL (ref 19.0–186.0)
Retic Ct Pct: 1.8 % (ref 0.4–3.1)

## 2024-04-19 LAB — IRON AND TIBC
Iron: 54 ug/dL (ref 45–182)
Saturation Ratios: 11 % — ABNORMAL LOW (ref 17.9–39.5)
TIBC: 503 ug/dL — ABNORMAL HIGH (ref 250–450)
UIBC: 449 ug/dL

## 2024-04-19 LAB — ECHOCARDIOGRAM COMPLETE
AR max vel: 1.62 cm2
AV Area VTI: 2.22 cm2
AV Area mean vel: 1.64 cm2
AV Mean grad: 7.5 mmHg
AV Peak grad: 13.2 mmHg
Ao pk vel: 1.82 m/s
Area-P 1/2: 5.7 cm2
Calc EF: 28.3 %
Height: 70 in
MV VTI: 1.78 cm2
S' Lateral: 4.5 cm
Single Plane A2C EF: 24 %
Single Plane A4C EF: 37.7 %
Weight: 3600 [oz_av]

## 2024-04-19 LAB — STREP PNEUMONIAE URINARY ANTIGEN: Strep Pneumo Urinary Antigen: NEGATIVE

## 2024-04-19 LAB — LIPID PANEL
Cholesterol: 193 mg/dL (ref 0–200)
HDL: 46 mg/dL (ref >40)
LDL Cholesterol: 132 mg/dL — ABNORMAL HIGH (ref 0–99)
Total CHOL/HDL Ratio: 4.2 ratio
Triglycerides: 75 mg/dL (ref <150)
VLDL: 15 mg/dL (ref 0–40)

## 2024-04-19 LAB — LACTIC ACID, PLASMA
Lactic Acid, Venous: 5.9 mmol/L (ref 0.5–1.9)
Lactic Acid, Venous: 4 mmol/L (ref 0.5–1.9)

## 2024-04-19 LAB — MAGNESIUM: Magnesium: 1.8 mg/dL (ref 1.7–2.4)

## 2024-04-19 LAB — FERRITIN: Ferritin: 13 ng/mL — ABNORMAL LOW (ref 24–336)

## 2024-04-19 LAB — VITAMIN B12: Vitamin B-12: 278 pg/mL (ref 180–914)

## 2024-04-19 LAB — FOLATE: Folate: 17.3 ng/mL (ref >5.9)

## 2024-04-19 LAB — PROCALCITONIN: Procalcitonin: <0.1 ng/mL

## 2024-04-19 MED ORDER — FUROSEMIDE 10 MG/ML IJ SOLN
40.0000 mg | Freq: Once | INTRAMUSCULAR | Status: AC
  Administered 2024-04-19: 40 mg via INTRAVENOUS
  Filled 2024-04-19: qty 4

## 2024-04-19 MED ORDER — AZITHROMYCIN 250 MG PO TABS
250.0000 mg | ORAL_TABLET | Freq: Every day | ORAL | Status: DC
  Administered 2024-04-20: 250 mg via ORAL
  Filled 2024-04-19: qty 1

## 2024-04-19 MED ORDER — SODIUM CHLORIDE 0.9 % IV SOLN
2.0000 g | INTRAVENOUS | Status: DC
  Administered 2024-04-19 – 2024-04-20 (×2): 2 g via INTRAVENOUS
  Filled 2024-04-19: qty 20

## 2024-04-19 MED ORDER — PERFLUTREN LIPID MICROSPHERE
1.0000 mL | INTRAVENOUS | Status: AC | PRN
  Administered 2024-04-19: 2 mL via INTRAVENOUS

## 2024-04-19 MED ORDER — AZITHROMYCIN 500 MG PO TABS
500.0000 mg | ORAL_TABLET | Freq: Every day | ORAL | Status: AC
  Administered 2024-04-19: 500 mg via ORAL
  Filled 2024-04-19: qty 1

## 2024-04-19 NOTE — ED Notes (Signed)
Patient back from procedure.

## 2024-04-19 NOTE — ED Notes (Signed)
 Assumed care of patient, pt resting in bed with eyes shut, respirations even and unlabored, 6L O2 nasal cannula in use, no distress noted

## 2024-04-19 NOTE — ED Notes (Signed)
 Patient off unit for thorocentesis

## 2024-04-19 NOTE — Progress Notes (Signed)
 PROGRESS NOTE    David Merritt  FMW:[id number] DOB: 20-Nov-1940 DOA: 04/18/2024 PCP: Pcp, No  Outpatient Specialists: none    Brief Narrative:   From admission h and p  David Merritt is a 83 y.o. male with medical history significant of HTN, HLD, pre-DM, GERD, depression, former smoker, anemia, obesity, colon cancer s/p of partial colectomy and chemotherapy), who presents with cough, SOB.   Patient states that his symptoms started last night, including cough with little mucus production and SOB which has been progressively worsening.  Patient also has chest pressure.  No fever or chills.  Patient has loose stool, no active diarrhea, nausea, vomiting, abdominal pain.  No symptoms of UTI. Patient normally does not use oxygen, was found to have moderate acute respiratory distress, oxygen desaturation to 88% on room air, which improved to 94% on 6 L oxygen.   Assessment & Plan:   Principal Problem:   Acute respiratory failure with hypoxia (HCC) Active Problems:   Bilateral pleural effusion   Myocardial injury   Elevated lactic acid level   Hyponatremia   Hypokalemia   Hypertension   HLD (hyperlipidemia)   Normocytic anemia   Obesity (BMI 30-39.9)  # CAP One day cough and shortness of breath. CT showing b/l effusions (no PE). Though no fever, leukocytosis, or elevated procal. Covid/flu/rsv neg. S/p vanc/zosyn  in the ER. - f/u sputum culture - check 20-pathogen RVP - ceftriaxone /azithromycin   # CHF? No edema or orthopnea. BNP markedly elevated to about 600. CT shows infiltrates, not pulm edema. Does have b/l effusions. Given lasix  40 IV yesterday. - pause on further diuresis today - f/u TTE  # Pleural effusions Possibly 2/2 CHF, infection. As the etiology is not clear and patient requiring oxygen, will pursue thoracentesis - IR thoracentesis with pleural fluid analysis including cytology  # Acute hypoxic respiratory failure 2/2 above processes. Requiring 6 liters  initially. Currently eating breakfast off oxygen, speaking in full sentences, denies dyspnea - continue [place] O2, wean as able  # Lactic acidosis Possibly 2/2 infection, possibly 2/2 chf. Has improved from yesterday to today - w/u as above  # Hyponatremia Likely 2/2 chf and/or sepsis. Sodium 127 on arrival, 130 today (corrects to 132)  # Iron  deficiency anemia HGB in 10s, no recent priors available for comparison. No report of melena or other bleeding - monitor for now  # HTN BP wnl  - holding home amlodipine   # Hyperglycemia 200s this morning - f/u a1c  # Heavy drinking Drinks 2-3 shots a day plus sometimes a beer or two. Denies history of withdrawal - monitor for s/s withdrawal  # Fullness of renal collecting systems Incidental on CT of chest. Patient reports no problems urinating. Does have history BPH - IR advises CT urogram to further characterize, can likely defer this to outpt setting.   # History colon cancer S/p partial colectomy, chemotherapy, in 2009 - consider ct abdomen/pelvis if fails to progress here  # Obesity noted  # Debility Lives alone - PT consult     DVT prophylaxis: heparin  Code Status: dnr/dni, confirmed with patient at bedside 8/27, 10 minutes spent advance care planning Family Communication: none at bedside  Level of care: Progressive Status is: Inpatient Remains inpatient appropriate because: severity of illness    Consultants:  none  Procedures: pending  Antimicrobials:  Vanc/zysyn > ceftriaxone /azithromycin     Subjective: Reports stable dyspnea, slightly improved. No chest pain  Objective: Vitals:   04/18/24 2120 04/19/24 0105 04/19/24 0327 04/19/24 0431  BP:   134/60   Pulse:   (!) 103 (!) 106  Resp:   (!) 21   Temp: 97.8 F (36.6 C) 97.8 F (36.6 C)  98.2 F (36.8 C)  TempSrc: Oral Oral  Axillary  SpO2:   95% 98%  Weight:      Height:       No intake or output data in the 24 hours ending 04/19/24  0820 Filed Weights   04/18/24 1732  Weight: 102.1 kg    Examination:  General exam: Appears calm and comfortable  Respiratory system: Clear to auscultation, decreased sounds at bases Cardiovascular system: S1 & S2 heard, RRR.   Gastrointestinal system: Abdomen is obese, soft and nontender. No organomegaly or masses felt. Normal bowel sounds heard. Central nervous system: Alert and oriented. No focal neurological deficits. Extremities: Symmetric 5 x 5 power. No LE edema Skin: No rashes, lesions or ulcers Psychiatry: Judgement and insight appear normal. Mood & affect appropriate.     Data Reviewed: I have personally reviewed following labs and imaging studies  CBC: Recent Labs  Lab 04/18/24 1739 04/19/24 0213  WBC 10.0 6.7  NEUTROABS 8.4*  --   HGB 10.4* 10.2*  HCT 33.4* 32.7*  MCV 80.3 81.1  PLT 321 310   Basic Metabolic Panel: Recent Labs  Lab 04/18/24 1739 04/18/24 2125 04/19/24 0213  NA 127* 128* 130*  K 3.6 3.3* 4.1  CL 94* 95* 97*  CO2 23 17* 19*  GLUCOSE 137* 228* 216*  BUN 6* 6* 9  CREATININE 0.78 0.95 0.98  CALCIUM 8.9 8.9 9.0  MG  --  1.8  --    GFR: Estimated Creatinine Clearance: 68.3 mL/min (by C-G formula based on SCr of 0.98 mg/dL). Liver Function Tests: Recent Labs  Lab 04/18/24 1739  AST 21  ALT 11  ALKPHOS 45  BILITOT 1.2  PROT 7.4  ALBUMIN 3.5   No results for input(s): LIPASE, AMYLASE in the last 168 hours. No results for input(s): AMMONIA in the last 168 hours. Coagulation Profile: Recent Labs  Lab 04/18/24 1739  INR 1.1   Cardiac Enzymes: No results for input(s): CKTOTAL, CKMB, CKMBINDEX, TROPONINI in the last 168 hours. BNP (last 3 results) No results for input(s): PROBNP in the last 8760 hours. HbA1C: No results for input(s): HGBA1C in the last 72 hours. CBG: No results for input(s): GLUCAP in the last 168 hours. Lipid Profile: Recent Labs    04/19/24 0213  CHOL 193  HDL 46  LDLCALC 132*   TRIG 75  CHOLHDL 4.2   Thyroid Function Tests: No results for input(s): TSH, T4TOTAL, FREET4, T3FREE, THYROIDAB in the last 72 hours. Anemia Panel: Recent Labs    04/18/24 2125 04/19/24 0213  VITAMINB12  --  278  FOLATE 17.3  --   FERRITIN 13*  --   TIBC 503*  --   IRON  54  --   RETICCTPCT  --  1.8   Urine analysis:    Component Value Date/Time   COLORURINE Yellow 09/08/2013 1158   APPEARANCEUR Hazy 09/08/2013 1158   LABSPEC 1.010 09/08/2013 1158   PHURINE 6.0 09/08/2013 1158   GLUCOSEU Negative 09/08/2013 1158   HGBUR 1+ 09/08/2013 1158   BILIRUBINUR Negative 09/08/2013 1158   KETONESUR Negative 09/08/2013 1158   PROTEINUR Negative 09/08/2013 1158   NITRITE Negative 09/08/2013 1158   LEUKOCYTESUR Trace 09/08/2013 1158   Sepsis Labs: @LABRCNTIP (procalcitonin:4,lacticidven:4)  ) Recent Results (from the past 240 hours)  Resp panel by RT-PCR (RSV, Flu  A&B, Covid) Anterior Nasal Swab     Status: None   Collection Time: 04/18/24  5:39 PM   Specimen: Anterior Nasal Swab  Result Value Ref Range Status   SARS Coronavirus 2 by RT PCR NEGATIVE NEGATIVE Final    Comment: (NOTE) SARS-CoV-2 target nucleic acids are NOT DETECTED.  The SARS-CoV-2 RNA is generally detectable in upper respiratory specimens during the acute phase of infection. The lowest concentration of SARS-CoV-2 viral copies this assay can detect is 138 copies/mL. A negative result does not preclude SARS-Cov-2 infection and should not be used as the sole basis for treatment or other patient management decisions. A negative result may occur with  improper specimen collection/handling, submission of specimen other than nasopharyngeal swab, presence of viral mutation(s) within the areas targeted by this assay, and inadequate number of viral copies(<138 copies/mL). A negative result must be combined with clinical observations, patient history, and epidemiological information. The expected result is  Negative.  Fact Sheet for Patients:  BloggerCourse.com  Fact Sheet for Healthcare Providers:  SeriousBroker.it  This test is no t yet approved or cleared by the [place]  FDA and  has been authorized for detection and/or diagnosis of SARS-CoV-2 by FDA under an Emergency Use Authorization (EUA). This EUA will remain  in effect (meaning this test can be used) for the duration of the COVID-19 declaration under Section 564(b)(1) of the Act, 21 U.S.C.section 360bbb-3(b)(1), unless the authorization is terminated  or revoked sooner.       Influenza A by PCR NEGATIVE NEGATIVE Final   Influenza B by PCR NEGATIVE NEGATIVE Final    Comment: (NOTE) The Xpert Xpress SARS-CoV-2/FLU/RSV plus assay is intended as an aid in the diagnosis of influenza from Nasopharyngeal swab specimens and should not be used as a sole basis for treatment. Nasal washings and aspirates are unacceptable for Xpert Xpress SARS-CoV-2/FLU/RSV testing.  Fact Sheet for Patients: BloggerCourse.com  Fact Sheet for Healthcare Providers: SeriousBroker.it  This test is not yet approved or cleared by the [place]  FDA and has been authorized for detection and/or diagnosis of SARS-CoV-2 by FDA under an Emergency Use Authorization (EUA). This EUA will remain in effect (meaning this test can be used) for the duration of the COVID-19 declaration under Section 564(b)(1) of the Act, 21 U.S.C. section 360bbb-3(b)(1), unless the authorization is terminated or revoked.     Resp Syncytial Virus by PCR NEGATIVE NEGATIVE Final    Comment: (NOTE) Fact Sheet for Patients: BloggerCourse.com  Fact Sheet for Healthcare Providers: SeriousBroker.it  This test is not yet approved or cleared by the [place]  FDA and has been authorized for detection and/or diagnosis of  SARS-CoV-2 by FDA under an Emergency Use Authorization (EUA). This EUA will remain in effect (meaning this test can be used) for the duration of the COVID-19 declaration under Section 564(b)(1) of the Act, 21 U.S.C. section 360bbb-3(b)(1), unless the authorization is terminated or revoked.  Performed at [facility], [address] [place], [place] [address]   Blood culture (routine x 2)     Status: None (Preliminary result)   Collection Time: 04/18/24  8:47 PM   Specimen: BLOOD  Result Value Ref Range Status   Specimen Description BLOOD BLOOD RIGHT HAND  Final   Special Requests   Final    BOTTLES DRAWN AEROBIC AND ANAEROBIC Blood Culture results may not be optimal due to an inadequate volume of blood received in culture bottles   Culture   Final    NO GROWTH <  12 HOURS Performed at [facility], [address] [place], [place] [address]    Report Status PENDING  Incomplete  Blood culture (routine x 2)     Status: None (Preliminary result)   Collection Time: 04/18/24  8:47 PM   Specimen: BLOOD  Result Value Ref Range Status   Specimen Description BLOOD BLOOD LEFT HAND  Final   Special Requests   Final    BOTTLES DRAWN AEROBIC AND ANAEROBIC Blood Culture results may not be optimal due to an inadequate volume of blood received in culture bottles   Culture   Final    NO GROWTH < 12 HOURS Performed at [facility], [address], [place], [place] [address]    Report Status PENDING  Incomplete  Expectorated Sputum Assessment w Gram Stain, Rflx to Resp Cult     Status: None   Collection Time: 04/18/24  8:59 PM   Specimen: Sputum  Result Value Ref Range Status   Specimen Description SPUTUM  Final   Special Requests NONE  Final   Sputum evaluation   Final    THIS SPECIMEN IS ACCEPTABLE FOR SPUTUM CULTURE Performed at [facility], [address] [place], [place] [address]    Report Status 04/18/2024 FINAL  Final  Culture, Respiratory w  Gram Stain     Status: None (Preliminary result)   Collection Time: 04/18/24  8:59 PM   Specimen: SPU  Result Value Ref Range Status   Specimen Description   Final    SPUTUM Performed at [facility], [address] [place], [place] [address]    Special Requests   Final    NONE Reflexed from [id number] Performed at [facility], [address] [place], [place] [address]    Gram Stain   Final    RARE WBC PRESENT, PREDOMINANTLY PMN MODERATE GRAM POSITIVE COCCI MODERATE GRAM NEGATIVE RODS Performed at [facility], [address] [place], [place] [address]    Culture PENDING  Incomplete   Report Status PENDING  Incomplete         Radiology Studies: CT Angio Chest PE W and/or Wo Contrast Result Date: 04/18/2024 CLINICAL DATA:  Tachycardia and cough, initial encounter EXAM: CT ANGIOGRAPHY CHEST WITH CONTRAST TECHNIQUE: Multidetector CT imaging of the chest was performed using the standard protocol during bolus administration of intravenous contrast. Multiplanar CT image reconstructions and MIPs were obtained to evaluate the vascular anatomy. RADIATION DOSE REDUCTION: This exam was performed according to the departmental dose-optimization program which includes automated exposure control, adjustment of the mA and/or kV according to patient size and/or use of iterative reconstruction technique. CONTRAST:  80mL OMNIPAQUE  IOHEXOL  350 MG/ML SOLN COMPARISON:  11/24/2012, chest x-ray from earlier in the same day. FINDINGS: Cardiovascular: Atherosclerotic calcifications of the thoracic aorta are noted without aneurysmal dilatation. The degree of opacification is limited. Pulmonary artery shows a normal branching pattern bilaterally. No intraluminal filling defect is identified to suggest pulmonary embolism. Coronary calcifications are noted. No cardiac enlargement is noted. Mediastinum/Nodes: Thoracic inlet is within normal limits. Scattered hilar nodes are noted particularly  on the right likely reactive in nature. Small mediastinal nodes are noted likely reactive in nature as well. The esophagus is within normal limits. Lungs/Pleura: Bilateral moderate pleural effusions are noted with associated lower lobe consolidation. Patchy infiltrate is noted within the left upper lobe. Bronchial thickening is noted bilaterally. Mild emphysematous changes are seen as well as mosaic attenuation suggestive of air trapping. Upper Abdomen: Visualized upper  abdomen demonstrates some fullness of the collecting systems bilaterally. No other focal abnormality is noted. Musculoskeletal: Degenerative changes of the thoracic spine are seen. No acute rib abnormality is noted. Review of the MIP images confirms the above findings. IMPRESSION: No evidence of pulmonary emboli. Bilateral lower lobe infiltrates with associated moderate effusions. Additionally left upper lobe infiltrate is seen. Fullness of the collecting systems bilaterally slightly worse on the left than the right. CT urogram may be helpful for further evaluation. Aortic Atherosclerosis (ICD10-I70.0) and Emphysema (ICD10-J43.9). Electronically Signed   By: Oneil Devonshire M.D.   On: 04/18/2024 19:37   DG Chest Portable 1 View Result Date: 04/18/2024 CLINICAL DATA:  Hypoxia. EXAM: PORTABLE CHEST 1 VIEW COMPARISON:  August 19, 2013 FINDINGS: The cardiac silhouette is mildly enlarged. There is marked severity calcification of the aortic arch. Mildly increased interstitial lung markings are noted with mild areas of atelectasis and/or infiltrate seen within the bilateral lung bases. No pleural effusion or pneumothorax is identified. Multilevel degenerative changes seen throughout the thoracic spine. IMPRESSION: 1. Mildly increased interstitial lung markings which are likely, in part, chronic in nature. A mild component interstitial edema cannot be excluded. 2. Mild bibasilar atelectasis and/or infiltrate. Electronically Signed   By: Suzen Dials  M.D.   On: 04/18/2024 18:33        Scheduled Meds:  amLODipine   5 mg Oral Daily   heparin   5,000 Units Subcutaneous Q8H   ipratropium-albuterol   3 mL Nebulization Q4H   sodium chloride   1 g Oral BID WC   Continuous Infusions:   LOS: 1 day     Devaughn KATHEE Ban, MD [facility]   If 7PM-7AM, please contact night-coverage [email or web address] Password [email or web address] 04/19/2024, 8:20 AM

## 2024-04-19 NOTE — Procedures (Signed)
 Patient presents for therapeutic and diagnostic thoracentesis. I was present during limited US  of the chest to evaluate for pleural effusion. US  limited shows minimal pleural fluid with  several lung flaps lying along the rib spaces. There is no adequate pocket of fluid to safely proceed with thoracentesis. Due to circumstances unable to perform a safe thoracentesis.   After discussion of the risks versus the benefits of the procedure the patient elected to defer the thoracentesis at this time.  Procedure not performed.   Weslie Pretlow CHRISTELLA Bal PA-C 04/19/2024 3:51 PM

## 2024-04-19 NOTE — ED Notes (Signed)
 PT ambulated to the restroom with walker

## 2024-04-19 NOTE — Evaluation (Signed)
 Physical Therapy Evaluation Patient Details Name: David Merritt MRN: [id number] DOB: 03/01/1941 Today's Date: 04/19/2024  History of Present Illness  83 y.o. male with medical history significant of HTN, HLD, pre-DM, GERD, depression, former smoker, anemia, obesity, colon cancer s/p of partial colectomy and chemotherapy), who presents with cough, SOB.  Clinical Impression  Pt pleasant and willing to participate.  Overall showed good confidence with bed mobility, donned socks independently while sitting EOB, rose to standing w/o assist and, using walker, managed ~100 ft with no LOBs.  SpO2 on room air.  SpO2 t/o the session seemed to stay mid 90s with minor fluctuations, some shortness of breath but generally able to hold conversation t/o the session.  Pt will benefit from continued PT while in-house to insure safe d/c planning and continued safe mobility progression.          If plan is discharge home, recommend the following:     Can travel by private vehicle        Equipment Recommendations    Recommendations for Other Services       Functional Status Assessment Patient has had a recent decline in their functional status and demonstrates the ability to make significant improvements in function in a reasonable and predictable amount of time.     Precautions / Restrictions Precautions Precautions: Fall Recall of Precautions/Restrictions: Intact Restrictions Weight Bearing Restrictions Per Provider Order: No      Mobility  Bed Mobility Overal bed mobility: Modified Independent             General bed mobility comments: Pt able to get up to sitting EOB with relative ease    Transfers Overall transfer level: Modified independent Equipment used: Rolling walker (2 wheels)               General transfer comment: Pt able to rise from standard height bed w/o assist, minimal cuing for set up and walker use    Ambulation/Gait Ambulation/Gait assistance:  Supervision Gait Distance (Feet): 100 Feet Assistive device: Rolling walker (2 wheels)         General Gait Details: Pt with slow and deliberate gait; no LOBs, did have some fatigue but able to hold conversation most of the time (on room air) with SpO2 staying in the 90s.  No LOBs or overt safety issues.  Stairs            Wheelchair Mobility     Tilt Bed    Modified Rankin (Stroke Patients Only)       Balance Overall balance assessment: Modified Independent (with walker)                                           Pertinent Vitals/Pain Pain Assessment Pain Assessment: No/denies pain    Home Living Family/patient expects to be discharged to:: Private residence Living Arrangements: Alone Available Help at Discharge: Family (2 family members live very close and can/do help PRN) Type of Home: Mobile home Home Access: Ramped entrance       Home Layout: One level Home Equipment: Agricultural consultant (2 wheels);Grab bars - tub/shower      Prior Function Prior Level of Function : Independent/Modified Independent             Mobility Comments: Pt reports out of home at least weakly ADLs Comments: Independent     Extremity/Trunk Assessment   Upper Extremity  Assessment Upper Extremity Assessment: Overall WFL for tasks assessed    Lower Extremity Assessment Lower Extremity Assessment: Overall WFL for tasks assessed       Communication        Cognition Arousal: Alert Behavior During Therapy: WFL for tasks assessed/performed                             Following commands: Intact       Cueing Cueing Techniques: Verbal cues     General Comments General comments (skin integrity, edema, etc.): Pt reports feeling a little weaker but ultimately close to his baseline    Exercises     Assessment/Plan    PT Assessment Patient does not need any further PT services (will maintain on caseload while admitted to insure safe d/c  and activity)  PT Problem List  Decreased strength, balance, activity tolerance and mobility       PT Treatment Interventions  Gait, mobility, balance, there ex, there act, edu   PT Goals (Current goals can be found in the Care Plan section)  Acute Rehab PT Goals Patient Stated Goal: go home PT Goal Formulation: With patient Time For Goal Achievement: 05/02/24 Potential to Achieve Goals: Good    Frequency  1x/wk     Co-evaluation               AM-PAC PT 6 Clicks Mobility  Outcome Measure Help needed turning from your back to your side while in a flat bed without using bedrails?: None Help needed moving from lying on your back to sitting on the side of a flat bed without using bedrails?: None Help needed moving to and from a bed to a chair (including a wheelchair)?: None Help needed standing up from a chair using your arms (e.g., wheelchair or bedside chair)?: None Help needed to walk in hospital room?: None Help needed climbing 3-5 steps with a railing? : A Little 6 Click Score: 23    End of Session Equipment Utilized During Treatment: Gait belt Activity Tolerance: Patient tolerated treatment well Patient left: with bed alarm set;with call bell/phone within reach Nurse Communication: Mobility status (SpO2 while walking on toom air) PT Visit Diagnosis: Difficulty in walking, not elsewhere classified (R26.2);Muscle weakness (generalized) (M62.81)    Time: 9155-9141 PT Time Calculation (min) (ACUTE ONLY): 14 min   Charges:   PT Evaluation $PT Eval Low Complexity: 1 Low   PT General Charges $$ ACUTE PT VISIT: 1 Visit         Carmin JONELLE Deed, DPT 04/19/2024, 10:59 AM

## 2024-04-19 NOTE — ED Notes (Signed)
 David Merritt (sister) [phone] called to get an update.  Pt stated to tell her he is doing great and watching tv.  I let her know that.

## 2024-04-20 ENCOUNTER — Other Ambulatory Visit (HOSPITAL_COMMUNITY): Payer: Self-pay

## 2024-04-20 ENCOUNTER — Other Ambulatory Visit: Payer: Self-pay

## 2024-04-20 ENCOUNTER — Telehealth (HOSPITAL_COMMUNITY): Payer: Self-pay | Admitting: Obstetrics and Gynecology

## 2024-04-20 ENCOUNTER — Encounter: Payer: Self-pay | Admitting: Internal Medicine

## 2024-04-20 ENCOUNTER — Other Ambulatory Visit (HOSPITAL_COMMUNITY): Payer: Self-pay | Admitting: Obstetrics and Gynecology

## 2024-04-20 DIAGNOSIS — I5020 Unspecified systolic (congestive) heart failure: Secondary | ICD-10-CM | POA: Insufficient documentation

## 2024-04-20 DIAGNOSIS — D509 Iron deficiency anemia, unspecified: Secondary | ICD-10-CM | POA: Insufficient documentation

## 2024-04-20 DIAGNOSIS — J9601 Acute respiratory failure with hypoxia: Secondary | ICD-10-CM | POA: Diagnosis not present

## 2024-04-20 LAB — LEGIONELLA PNEUMOPHILA SEROGP 1 UR AG: L. pneumophila Serogp 1 Ur Ag: NEGATIVE

## 2024-04-20 LAB — BASIC METABOLIC PANEL WITH GFR
Anion gap: 7 (ref 5–15)
BUN: 18 mg/dL (ref 8–23)
CO2: 24 mmol/L (ref 22–32)
Calcium: 9.1 mg/dL (ref 8.9–10.3)
Chloride: 102 mmol/L (ref 98–111)
Creatinine, Ser: 0.99 mg/dL (ref 0.61–1.24)
GFR, Estimated: >60 mL/min (ref >60)
Glucose, Bld: 122 mg/dL — ABNORMAL HIGH (ref 70–99)
Potassium: 4.1 mmol/L (ref 3.5–5.1)
Sodium: 133 mmol/L — ABNORMAL LOW (ref 135–145)

## 2024-04-20 LAB — CBC
HCT: 31.2 % — ABNORMAL LOW (ref 39.0–52.0)
Hemoglobin: 9.8 g/dL — ABNORMAL LOW (ref 13.0–17.0)
MCH: 25.3 pg — ABNORMAL LOW (ref 26.0–34.0)
MCHC: 31.4 g/dL (ref 30.0–36.0)
MCV: 80.6 fL (ref 80.0–100.0)
Platelets: 322 K/uL (ref 150–400)
RBC: 3.87 MIL/uL — ABNORMAL LOW (ref 4.22–5.81)
RDW: 17.9 % — ABNORMAL HIGH (ref 11.5–15.5)
WBC: 12.3 K/uL — ABNORMAL HIGH (ref 4.0–10.5)
nRBC: 0 % (ref 0.0–0.2)

## 2024-04-20 LAB — HEMOGLOBIN A1C
Hgb A1c MFr Bld: 6.4 % — ABNORMAL HIGH (ref 4.8–5.6)
Mean Plasma Glucose: 137 mg/dL

## 2024-04-20 LAB — LACTIC ACID, PLASMA: Lactic Acid, Venous: 1.4 mmol/L (ref 0.5–1.9)

## 2024-04-20 MED ORDER — IRON SUCROSE 300 MG IVPB - SIMPLE MED
300.0000 mg | Freq: Once | Status: AC
  Administered 2024-04-20: 300 mg via INTRAVENOUS
  Filled 2024-04-20: qty 300

## 2024-04-20 MED ORDER — DAPAGLIFLOZIN PROPANEDIOL 5 MG PO TABS
5.0000 mg | ORAL_TABLET | Freq: Every day | ORAL | 1 refills | Status: DC
  Filled 2024-04-20: qty 30, 30d supply, fill #0
  Filled 2024-05-16: qty 30, 30d supply, fill #1

## 2024-04-20 MED ORDER — AZITHROMYCIN 250 MG PO TABS
ORAL_TABLET | ORAL | 0 refills | Status: DC
  Filled 2024-04-20: qty 3, 3d supply, fill #0

## 2024-04-20 MED ORDER — LOSARTAN POTASSIUM 25 MG PO TABS
25.0000 mg | ORAL_TABLET | Freq: Every day | ORAL | 1 refills | Status: DC
  Filled 2024-04-20: qty 30, 30d supply, fill #0

## 2024-04-20 MED ORDER — EMPAGLIFLOZIN 10 MG PO TABS
10.0000 mg | ORAL_TABLET | Freq: Every day | ORAL | 1 refills | Status: DC
  Filled 2024-04-20: qty 30, 30d supply, fill #0

## 2024-04-20 MED ORDER — CARVEDILOL 3.125 MG PO TABS
3.1250 mg | ORAL_TABLET | Freq: Two times a day (BID) | ORAL | 1 refills | Status: AC
  Filled 2024-04-20: qty 60, 30d supply, fill #0
  Filled 2024-05-16: qty 60, 30d supply, fill #1

## 2024-04-20 MED ORDER — AMOXICILLIN-POT CLAVULANATE 875-125 MG PO TABS
1.0000 | ORAL_TABLET | Freq: Two times a day (BID) | ORAL | 0 refills | Status: AC
  Filled 2024-04-20: qty 6, 3d supply, fill #0

## 2024-04-20 MED ORDER — FUROSEMIDE 20 MG PO TABS
20.0000 mg | ORAL_TABLET | Freq: Every day | ORAL | 1 refills | Status: AC
  Filled 2024-04-20: qty 30, 30d supply, fill #0
  Filled 2024-05-16: qty 30, 30d supply, fill #1

## 2024-04-20 NOTE — Discharge Summary (Addendum)
 David Merritt FMW:[id number] DOB: 09/19/1940 DOA: 04/18/2024  PCP: Pcp, No  Admit date: 04/18/2024 Discharge date: 04/20/2024  Time spent: 35 minutes  Recommendations for Outpatient Follow-up:  Chf clinic f/u, check kidney function, electrolytes, and hemoglobin at that visit Establish with a pcp, further w/u of iron  deficiency anemia, renal collecting system dilation, etc. F/u A1c (pending at time of d/c)    Discharge Diagnoses:  Principal Problem:   Acute respiratory failure with hypoxia (HCC) Active Problems:   Bilateral pleural effusion   Myocardial injury   Elevated lactic acid level   Hyponatremia   Hypokalemia   Hypertension   HLD (hyperlipidemia)   Normocytic anemia   Obesity (BMI 30-39.9)   Iron  deficiency anemia   HFrEF (heart failure with reduced ejection fraction) (HCC)   Discharge Condition: improved  Diet recommendation: heart healthy  Filed Weights   04/18/24 1732 04/20/24 0448  Weight: 102.1 kg 102.8 kg    History of present illness:  From admission h and p David Merritt is a 83 y.o. male with medical history significant of HTN, HLD, pre-DM, GERD, depression, former smoker, anemia, obesity, colon cancer s/p of partial colectomy and chemotherapy), who presents with cough, SOB.   Patient states that his symptoms started last night, including cough with little mucus production and SOB which has been progressively worsening.  Patient also has chest pressure.  No fever or chills.  Patient has loose stool, no active diarrhea, nausea, vomiting, abdominal pain.  No symptoms of UTI. Patient normally does not use oxygen, was found to have moderate acute respiratory distress, oxygen desaturation to 88% on room air, which improved to 94% on 6 L oxygen.  Hospital Course:   Patient presents with cough and shortness of breath. Hypoxic and started on [place] O2. Infiltrates seen on CT though no fever or leukocytosis or elevated procal, respiratory testing negative.  Patient was treated with antibiotics. CT showed pleural effusion, thoracentesis ordered but they were too small to tap. Bnp elevated and TTE shows new systolic heart failure with ef of 35-40, no chest pain or sig trop elevation to suggest acute ischemia. Patient was treated with IV lasix . Symptoms essentially resolved, weaned to room air, ambulated satisfactorily with PT, no home health needs identified. Etiology appears to be new CHF with possible CAP. We will finish a course of abx for the CAP (home with augmentin  and azithromycin ). For new CHF, CHF naviagtor will meet with patient and arrange close f/u. Patient is iron  deficient with stable mild anemia and no bleeding, given new chf we elected to give IV iron  prior to discharge. He will need to establish with a pcp for a multitude of reasons, including further investigation of iron  deficiency anemia. We will start patient on lasix , losartan , farxiga , and coreg  for his chf. CTA showed no PE but incidentally fullness of renal collecting system - consider outpatient CT urogram. Glucose elevated - A1c pending at time of discharge. Hyponatremia resolved with diuresis so suspect this was chf mediated. Lactic acidosis also resolved with above treatments. D/c plan reviewed with patient's sister.   Procedures: none   Consultations: none  Discharge Exam: Vitals:   04/20/24 0752 04/20/24 1141  BP: 108/63 109/64  Pulse: 99 (!) 109  Resp:    Temp: 97.7 F (36.5 C) 98 F (36.7 C)  SpO2: 95% 94%    General: NAD Cardiovascular: RRR, soft systolic murmur Respiratory: CTAB decreased at bases  Discharge Instructions   Discharge Instructions     Amb Referral  to Intravenous Iron  Therapy   Complete by: As directed    You have been referred to [facility] Infusion team for IV Iron  Infusions. The infusion pharmacy team will reach out to you with appointment information.    Primary Diagnosis Code for IV Iron : D50.9 - Iron  deficiency Anemia   Secondary  diagnosis code for IV iron : I50.xx - Heart failure related dx codes   Diet - low sodium heart healthy   Complete by: As directed    Increase activity slowly   Complete by: As directed       Allergies as of 04/20/2024       Reactions   Iodinated Contrast Media Other (See Comments)   Pt unsure if this is correct        Medication List     STOP taking these medications    amLODipine  5 MG tablet Commonly known as: NORVASC        TAKE these medications    amoxicillin -clavulanate 875-125 MG tablet Commonly known as: AUGMENTIN  Take 1 tablet by mouth 2 (two) times daily for 3 days. Beginning 8/29   azithromycin  250 MG tablet Commonly known as: ZITHROMAX  Take one tab daily for 3 days (beginning 8/29) Start taking on: April 21, 2024   carvedilol  3.125 MG tablet Commonly known as: Coreg  Take 1 tablet (3.125 mg total) by mouth 2 (two) times daily.   dapagliflozin  propanediol 5 MG Tabs tablet Commonly known as: Farxiga  Take 1 tablet (5 mg total) by mouth daily before breakfast.   furosemide  20 MG tablet Commonly known as: Lasix  Take 1 tablet (20 mg total) by mouth daily.   losartan  25 MG tablet Commonly known as: Cozaar  Take 1 tablet (25 mg total) by mouth daily.       Allergies  Allergen Reactions   Iodinated Contrast Media Other (See Comments)    Pt unsure if this is correct    Follow-up Information     [facility]. Go on 04/26/2024.   Specialty: Cardiology Why: Hospital Follow-Up 04/26/24 @ 3:00 PM Please bring all medications to follow-up appointment [facility], [address] Second Floor [place] Parking at the door. Contact information: [address] David Merritt  [address] [phone]                 The results of significant diagnostics from this hospitalization (including imaging, microbiology, ancillary and laboratory) are listed below for reference.     Significant Diagnostic Studies: ECHOCARDIOGRAM COMPLETE Result Date: 04/19/2024    ECHOCARDIOGRAM REPORT   Patient Name:   David Merritt Parlee Date of Exam: 04/19/2024 Medical Rec #:  [id number]         Height:       70.0 in Accession #:    [id number]        Weight:       225.0 lb Date of Birth:  May 05, 1941         BSA:          2.194 m Patient Age:    83 years          BP:           134/60 mmHg Patient Gender: M                 HR:           118 bpm. Exam Location:  [facility] Procedure: 2D Echo, Cardiac Doppler, Color Doppler and Intracardiac  Opacification Agent (Both Spectral and Color Flow Doppler were            utilized during procedure). Indications:     CHF-Acute Diastolic I50.31  History:         Patient has no prior history of Echocardiogram examinations.  Sonographer:     Ashley McNeely-Sloane Referring Phys:  [id number] XILIN NIU Diagnosing Phys: Marsa Dooms MD IMPRESSIONS  1. Left ventricular ejection fraction, by estimation, is 35 to 40%. The left ventricle has moderately decreased function. The left ventricle has no regional wall motion abnormalities. Indeterminate diastolic filling due to E-A fusion.  2. Right ventricular systolic function is normal. The right ventricular size is normal.  3. The mitral valve is normal in structure. No evidence of mitral valve regurgitation. No evidence of mitral stenosis.  4. The aortic valve is normal in structure. Aortic valve regurgitation is mild. No aortic stenosis is present.  5. The inferior vena cava is normal in size with greater than 50% respiratory variability, suggesting right atrial pressure of 3 mmHg. FINDINGS  Left Ventricle: Left ventricular ejection fraction, by estimation, is 35 to 40%. The left ventricle has moderately decreased function. The left ventricle has no regional wall motion abnormalities. Definity  contrast agent was given IV to delineate the left ventricular endocardial borders. Strain was performed and the global longitudinal  strain is indeterminate. The left ventricular internal cavity size was normal in size. There is no left ventricular hypertrophy. Indeterminate diastolic filling due to  E-A fusion. Right Ventricle: The right ventricular size is normal. No increase in right ventricular wall thickness. Right ventricular systolic function is normal. Left Atrium: Left atrial size was normal in size. Right Atrium: Right atrial size was normal in size. Pericardium: There is no evidence of pericardial effusion. Mitral Valve: The mitral valve is normal in structure. No evidence of mitral valve regurgitation. No evidence of mitral valve stenosis. MV peak gradient, 11.4 mmHg. The mean mitral valve gradient is 4.0 mmHg. Tricuspid Valve: The tricuspid valve is normal in structure. Tricuspid valve regurgitation is not demonstrated. No evidence of tricuspid stenosis. Aortic Valve: The aortic valve is normal in structure. Aortic valve regurgitation is mild. No aortic stenosis is present. Aortic valve mean gradient measures 7.5 mmHg. Aortic valve peak gradient measures 13.2 mmHg. Aortic valve area, by VTI measures 2.22  cm. Pulmonic Valve: The pulmonic valve was normal in structure. Pulmonic valve regurgitation is not visualized. No evidence of pulmonic stenosis. Aorta: The aortic root is normal in size and structure. Venous: The inferior vena cava is normal in size with greater than 50% respiratory variability, suggesting right atrial pressure of 3 mmHg. IAS/Shunts: No atrial level shunt detected by color flow Doppler. Additional Comments: 3D was performed not requiring image post processing on an independent workstation and was indeterminate.  LEFT VENTRICLE PLAX 2D LVIDd:         5.30 cm      Diastology LVIDs:         4.50 cm      LV e' medial:    5.22 cm/s LV PW:         1.30 cm      LV E/e' medial:  29.3 LV IVS:        1.00 cm      LV e' lateral:   10.12 cm/s LVOT diam:     2.00 cm      LV E/e' lateral: 15.1 LV SV:         60 LV  SV Index:    27 LVOT Area:     3.14 cm  LV Volumes (MOD) LV vol d, MOD A2C: 62.1 ml LV vol d, MOD A4C: 131.0 ml LV vol s, MOD A2C: 47.2 ml LV vol s, MOD A4C: 81.6 ml LV SV MOD A2C:     14.9 ml LV SV MOD A4C:     131.0 ml LV SV MOD BP:      26.9 ml RIGHT VENTRICLE             IVC RV Basal diam:  3.40 cm     IVC diam: 2.50 cm RV Mid diam:    2.90 cm RV S prime:     14.50 cm/s TAPSE (M-mode): 1.4 cm LEFT ATRIUM             Index        RIGHT ATRIUM           Index LA diam:        4.40 cm 2.01 cm/m   RA Area:     14.60 cm LA Vol (A2C):   62.2 ml 28.34 ml/m  RA Volume:   36.70 ml  16.72 ml/m LA Vol (A4C):   63.5 ml 28.94 ml/m LA Biplane Vol: 67.0 ml 30.53 ml/m  AORTIC VALVE                     PULMONIC VALVE AV Area (Vmax):    1.62 cm      PV Vmax:        0.92 m/s AV Area (Vmean):   1.64 cm      PV Vmean:       64.700 cm/s AV Area (VTI):     2.22 cm      PV VTI:         0.129 m AV Vmax:           181.50 cm/s   PV Peak grad:   3.4 mmHg AV Vmean:          121.500 cm/s  PV Mean grad:   2.0 mmHg AV VTI:            0.270 m       RVOT Peak grad: 2 mmHg AV Peak Grad:      13.2 mmHg AV Mean Grad:      7.5 mmHg LVOT Vmax:         93.80 cm/s LVOT Vmean:        63.300 cm/s LVOT VTI:          0.191 m LVOT/AV VTI ratio: 0.71  AORTA Ao Root diam: 3.20 cm Ao Asc diam:  3.20 cm MITRAL VALVE MV Area (PHT): 5.70 cm     SHUNTS MV Area VTI:   1.78 cm     Systemic VTI:  0.19 m MV Peak grad:  11.4 mmHg    Systemic Diam: 2.00 cm MV Mean grad:  4.0 mmHg     Pulmonic VTI:  0.087 m MV Vmax:       1.69 m/s MV Vmean:      90.3 cm/s MV Decel Time: 133 msec MV E velocity: 153.00 cm/s Marsa Dooms MD Electronically signed by Marsa Dooms MD Signature Date/Time: 04/19/2024/4:14:37 PM    Final    US  CHEST (PLEURAL EFFUSION) Result Date: 04/19/2024 CLINICAL DATA:  Patient presented to the ED on 04/18/2024 with complaints of worsening shortness of breath and productive cough. CTA revealed bilateral lower lobe infiltrates with moderate  pleural effusions. Request for diagnostic and therapeutic thoracentesis. Limited ultrasound of the chest done prior to possible thoracentesis. EXAM: CHEST ULTRASOUND COMPARISON:  Chest CT 04/18/2024 FINDINGS: There are small pleural effusions bilaterally with large lung flaps and no window to allow for safe approach for thoracentesis. IMPRESSION: Small pleural effusions. No window to allow for safe approach for thoracentesis. Ultrasound performed by: Sherrilee Bal, PA-C Electronically Signed   By: Juliene Balder M.D.   On: 04/19/2024 15:01   CT Angio Chest PE W and/or Wo Contrast Result Date: 04/18/2024 CLINICAL DATA:  Tachycardia and cough, initial encounter EXAM: CT ANGIOGRAPHY CHEST WITH CONTRAST TECHNIQUE: Multidetector CT imaging of the chest was performed using the standard protocol during bolus administration of intravenous contrast. Multiplanar CT image reconstructions and MIPs were obtained to evaluate the vascular anatomy. RADIATION DOSE REDUCTION: This exam was performed according to the departmental dose-optimization program which includes automated exposure control, adjustment of the mA and/or kV according to patient size and/or use of iterative reconstruction technique. CONTRAST:  80mL OMNIPAQUE  IOHEXOL  350 MG/ML SOLN COMPARISON:  11/24/2012, chest x-ray from earlier in the same day. FINDINGS: Cardiovascular: Atherosclerotic calcifications of the thoracic aorta are noted without aneurysmal dilatation. The degree of opacification is limited. Pulmonary artery shows a normal branching pattern bilaterally. No intraluminal filling defect is identified to suggest pulmonary embolism. Coronary calcifications are noted. No cardiac enlargement is noted. Mediastinum/Nodes: Thoracic inlet is within normal limits. Scattered hilar nodes are noted particularly on the right likely reactive in nature. Small mediastinal nodes are noted likely reactive in nature as well. The esophagus is within normal limits.  Lungs/Pleura: Bilateral moderate pleural effusions are noted with associated lower lobe consolidation. Patchy infiltrate is noted within the left upper lobe. Bronchial thickening is noted bilaterally. Mild emphysematous changes are seen as well as mosaic attenuation suggestive of air trapping. Upper Abdomen: Visualized upper abdomen demonstrates some fullness of the collecting systems bilaterally. No other focal abnormality is noted. Musculoskeletal: Degenerative changes of the thoracic spine are seen. No acute rib abnormality is noted. Review of the MIP images confirms the above findings. IMPRESSION: No evidence of pulmonary emboli. Bilateral lower lobe infiltrates with associated moderate effusions. Additionally left upper lobe infiltrate is seen. Fullness of the collecting systems bilaterally slightly worse on the left than the right. CT urogram may be helpful for further evaluation. Aortic Atherosclerosis (ICD10-I70.0) and Emphysema (ICD10-J43.9). Electronically Signed   By: Oneil Devonshire M.D.   On: 04/18/2024 19:37   DG Chest Portable 1 View Result Date: 04/18/2024 CLINICAL DATA:  Hypoxia. EXAM: PORTABLE CHEST 1 VIEW COMPARISON:  August 19, 2013 FINDINGS: The cardiac silhouette is mildly enlarged. There is marked severity calcification of the aortic arch. Mildly increased interstitial lung markings are noted with mild areas of atelectasis and/or infiltrate seen within the bilateral lung bases. No pleural effusion or pneumothorax is identified. Multilevel degenerative changes seen throughout the thoracic spine. IMPRESSION: 1. Mildly increased interstitial lung markings which are likely, in part, chronic in nature. A mild component interstitial edema cannot be excluded. 2. Mild bibasilar atelectasis and/or infiltrate. Electronically Signed   By: Suzen Dials M.D.   On: 04/18/2024 18:33    Microbiology: Recent Results (from the past 240 hours)  Resp panel by RT-PCR (RSV, Flu A&B, Covid) Anterior  Nasal Swab     Status: None   Collection Time: 04/18/24  5:39 PM   Specimen: Anterior Nasal Swab  Result Value Ref Range Status   SARS Coronavirus 2 by RT PCR NEGATIVE NEGATIVE  Final    Comment: (NOTE) SARS-CoV-2 target nucleic acids are NOT DETECTED.  The SARS-CoV-2 RNA is generally detectable in upper respiratory specimens during the acute phase of infection. The lowest concentration of SARS-CoV-2 viral copies this assay can detect is 138 copies/mL. A negative result does not preclude SARS-Cov-2 infection and should not be used as the sole basis for treatment or other patient management decisions. A negative result may occur with  improper specimen collection/handling, submission of specimen other than nasopharyngeal swab, presence of viral mutation(s) within the areas targeted by this assay, and inadequate number of viral copies(<138 copies/mL). A negative result must be combined with clinical observations, patient history, and epidemiological information. The expected result is Negative.  Fact Sheet for Patients:  BloggerCourse.com  Fact Sheet for Healthcare Providers:  SeriousBroker.it  This test is no t yet approved or cleared by the [place]  FDA and  has been authorized for detection and/or diagnosis of SARS-CoV-2 by FDA under an Emergency Use Authorization (EUA). This EUA will remain  in effect (meaning this test can be used) for the duration of the COVID-19 declaration under Section 564(b)(1) of the Act, 21 U.S.C.section 360bbb-3(b)(1), unless the authorization is terminated  or revoked sooner.       Influenza A by PCR NEGATIVE NEGATIVE Final   Influenza B by PCR NEGATIVE NEGATIVE Final    Comment: (NOTE) The Xpert Xpress SARS-CoV-2/FLU/RSV plus assay is intended as an aid in the diagnosis of influenza from Nasopharyngeal swab specimens and should not be used as a sole basis for treatment. Nasal washings  and aspirates are unacceptable for Xpert Xpress SARS-CoV-2/FLU/RSV testing.  Fact Sheet for Patients: BloggerCourse.com  Fact Sheet for Healthcare Providers: SeriousBroker.it  This test is not yet approved or cleared by the [place]  FDA and has been authorized for detection and/or diagnosis of SARS-CoV-2 by FDA under an Emergency Use Authorization (EUA). This EUA will remain in effect (meaning this test can be used) for the duration of the COVID-19 declaration under Section 564(b)(1) of the Act, 21 U.S.C. section 360bbb-3(b)(1), unless the authorization is terminated or revoked.     Resp Syncytial Virus by PCR NEGATIVE NEGATIVE Final    Comment: (NOTE) Fact Sheet for Patients: BloggerCourse.com  Fact Sheet for Healthcare Providers: SeriousBroker.it  This test is not yet approved or cleared by the [place]  FDA and has been authorized for detection and/or diagnosis of SARS-CoV-2 by FDA under an Emergency Use Authorization (EUA). This EUA will remain in effect (meaning this test can be used) for the duration of the COVID-19 declaration under Section 564(b)(1) of the Act, 21 U.S.C. section 360bbb-3(b)(1), unless the authorization is terminated or revoked.  Performed at [facility], [address] [place], [place] [address]   Blood culture (routine x 2)     Status: None (Preliminary result)   Collection Time: 04/18/24  8:47 PM   Specimen: BLOOD  Result Value Ref Range Status   Specimen Description BLOOD BLOOD RIGHT HAND  Final   Special Requests   Final    BOTTLES DRAWN AEROBIC AND ANAEROBIC Blood Culture results may not be optimal due to an inadequate volume of blood received in culture bottles   Culture   Final    NO GROWTH < 12 HOURS Performed at [facility] [address], [place], [place] [address]    Report Status PENDING  Incomplete   Blood culture (routine x 2)     Status: None (Preliminary result)   Collection Time: 04/18/24  8:47 PM   Specimen: BLOOD  Result Value Ref Range Status   Specimen Description BLOOD BLOOD LEFT HAND  Final   Special Requests   Final    BOTTLES DRAWN AEROBIC AND ANAEROBIC Blood Culture results may not be optimal due to an inadequate volume of blood received in culture bottles   Culture   Final    NO GROWTH < 12 HOURS Performed at [facility], [address] [place], [place] [address]    Report Status PENDING  Incomplete  Expectorated Sputum Assessment w Gram Stain, Rflx to Resp Cult     Status: None   Collection Time: 04/18/24  8:59 PM   Specimen: Sputum  Result Value Ref Range Status   Specimen Description SPUTUM  Final   Special Requests NONE  Final   Sputum evaluation   Final    THIS SPECIMEN IS ACCEPTABLE FOR SPUTUM CULTURE Performed at [facility], [address] [place], [place] [address]    Report Status 04/18/2024 FINAL  Final  Culture, Respiratory w Gram Stain     Status: None (Preliminary result)   Collection Time: 04/18/24  8:59 PM   Specimen: SPU  Result Value Ref Range Status   Specimen Description   Final    SPUTUM Performed at [facility], [address], [place], [place] [address]    Special Requests   Final    NONE Reflexed from [id number] Performed at [facility], [address] [place], [place] [address]    Gram Stain   Final    RARE WBC PRESENT, PREDOMINANTLY PMN MODERATE GRAM POSITIVE COCCI MODERATE GRAM NEGATIVE RODS    Culture   Final    CULTURE REINCUBATED FOR BETTER GROWTH Performed at [facility], [address] [place], [place] [address]    Report Status PENDING  Incomplete  Respiratory (~20 pathogens) panel by PCR     Status: None   Collection Time: 04/19/24  9:10 AM   Specimen: Nasopharyngeal Swab; Respiratory  Result Value Ref Range Status   Adenovirus NOT DETECTED NOT DETECTED Final    Coronavirus 229E NOT DETECTED NOT DETECTED Final    Comment: (NOTE) The Coronavirus on the Respiratory Panel, DOES NOT test for the novel  Coronavirus (2019 nCoV)    Coronavirus HKU1 NOT DETECTED NOT DETECTED Final   Coronavirus NL63 NOT DETECTED NOT DETECTED Final   Coronavirus OC43 NOT DETECTED NOT DETECTED Final   Metapneumovirus NOT DETECTED NOT DETECTED Final   Rhinovirus / Enterovirus NOT DETECTED NOT DETECTED Final   Influenza A NOT DETECTED NOT DETECTED Final   Influenza B NOT DETECTED NOT DETECTED Final   Parainfluenza Virus 1 NOT DETECTED NOT DETECTED Final   Parainfluenza Virus 2 NOT DETECTED NOT DETECTED Final   Parainfluenza Virus 3 NOT DETECTED NOT DETECTED Final   Parainfluenza Virus 4 NOT DETECTED NOT DETECTED Final   Respiratory Syncytial Virus NOT DETECTED NOT DETECTED Final   Bordetella pertussis NOT DETECTED NOT DETECTED Final   Bordetella Parapertussis NOT DETECTED NOT DETECTED Final   Chlamydophila pneumoniae NOT DETECTED NOT DETECTED Final   Mycoplasma pneumoniae NOT DETECTED NOT DETECTED Final    Comment: Performed at [facility], [address], [place], [place] [address]     Labs: Basic Metabolic Panel: Recent Labs  Lab 04/18/24 1739 04/18/24 2125 04/19/24 0213 04/19/24 1456 04/20/24 0440  NA 127* 128* 130* 136 133*  K 3.6 3.3* 4.1 3.3* 4.1  CL 94* 95* 97* 112* 102  CO2 23 17* 19* 17* 24  GLUCOSE 137* 228* 216* 136* 122*  BUN 6* 6* 9 10 18   CREATININE 0.78 0.95 0.98 0.79 0.99  CALCIUM 8.9 8.9 9.0 7.2* 9.1  MG  --  1.8  --   --   --    Liver Function Tests: Recent Labs  Lab 04/18/24 1739  AST 21  ALT 11  ALKPHOS 45  BILITOT 1.2  PROT 7.4  ALBUMIN 3.5   No results for input(s): LIPASE, AMYLASE in the last 168 hours. No results for input(s): AMMONIA in the last 168 hours. CBC: Recent Labs  Lab 04/18/24 1739 04/19/24 0213 04/20/24 0440  WBC 10.0 6.7 12.3*  NEUTROABS 8.4*  --   --   HGB 10.4* 10.2* 9.8*  HCT 33.4*  32.7* 31.2*  MCV 80.3 81.1 80.6  PLT 321 310 322   Cardiac Enzymes: No results for input(s): CKTOTAL, CKMB, CKMBINDEX, TROPONINI in the last 168 hours. BNP: BNP (last 3 results) Recent Labs    04/18/24 1739  BNP 592.3*    ProBNP (last 3 results) No results for input(s): PROBNP in the last 8760 hours.  CBG: No results for input(s): GLUCAP in the last 168 hours.     Signed:  Devaughn KATHEE Ban MD.  [facility] 04/20/2024, 1:32 PM

## 2024-04-20 NOTE — Plan of Care (Signed)
  Problem: Activity: Goal: Risk for activity intolerance will decrease Outcome: Progressing   Problem: Nutrition: Goal: Adequate nutrition will be maintained Outcome: Progressing   Problem: Elimination: Goal: Will not experience complications related to urinary retention Outcome: Progressing   Problem: Pain Managment: Goal: General experience of comfort will improve and/or be controlled Outcome: Progressing   Problem: Safety: Goal: Ability to remain free from injury will improve Outcome: Progressing   Problem: Skin Integrity: Goal: Risk for impaired skin integrity will decrease Outcome: Progressing

## 2024-04-20 NOTE — Care Management Important Message (Signed)
 Important Message  Patient Details  Name: David Merritt MRN: [id number] Date of Birth: 08-25-1940   Important Message Given:  Yes - Medicare IM     David Merritt 04/20/2024, 1:32 PM

## 2024-04-20 NOTE — Progress Notes (Signed)
 Heart Failure Nurse Navigator Progress Note  PCP: Pcp, No PCP-Cardiologist: None Admission Diagnosis: Acute respiratory failure with hypoxia (HCC) Pneumonia of both lungs due to infectious organism, unspecified part of the lung Admitted from: Home via EMS  Presentation:   David Merritt presented with shortness of breath and productive cough.  EMS arrival 88% on RA and does not wear oxygen at home.  Was put on 2 L by EMS and they titrated it up to [facility]   BP 160/73, HR 70, 94% on 6 L. Hx: Hypertension, hyperlipidemia, remote hx of colon cancer, chronic anemia.  No leg pain or swelling.  BNP 592.3.  HS-Troponin 21. CXR: Mildly increased interstitial lung markings which are likely, in part, chronic in nature. Mild component interstitial edema cannot be excluded.  Mild bibasilar atelectasis and /or infiltrate.  New CHF diagnosis.    ECHO/ LVEF: 35-40%  Clinical Course:  Past Medical History:  Diagnosis Date   Colon cancer (HCC)    HLD (hyperlipidemia)    Hypertension    Normocytic anemia    Obesity (BMI 30-39.9)      Social History   Socioeconomic History   Marital status: Single    Spouse name: Not on file   Number of children: Not on file   Years of education: Not on file   Highest education level: Not on file  Occupational History   Not on file  Tobacco Use   Smoking status: Former    Types: Cigarettes, Pipe   Smokeless tobacco: Never  Substance and Sexual Activity   Alcohol use: Yes    Alcohol/week: 14.0 standard drinks of alcohol    Types: 14 Shots of liquor per week   Drug use: Never   Sexual activity: Not on file  Other Topics Concern   Not on file  Social History Narrative   Not on file   Social Drivers of Health   Financial Resource Strain: Not on file  Food Insecurity: No Food Insecurity (04/19/2024)   Hunger Vital Sign    Worried About Running Out of Food in the Last Year: Never true    Ran Out of Food in the Last Year: Never true  Transportation  Needs: No Transportation Needs (04/19/2024)   PRAPARE - Administrator, Civil Service (Medical): No    Lack of Transportation (Non-Medical): No  Physical Activity: Not on file  Stress: Not on file  Social Connections: Socially Isolated (04/19/2024)   Social Connection and Isolation Panel    Frequency of Communication with Friends and Family: Once a week    Frequency of Social Gatherings with Friends and Family: Once a week    Attends Religious Services: Never    Database administrator or Organizations: No    Attends Engineer, structural: Never    Marital Status: Divorced   Water engineer and Provision:  Detailed education and instructions provided on heart failure disease management including the following:  Signs and symptoms of Heart Failure When to call the physician Importance of daily weights Low sodium diet Fluid restriction Medication management Anticipated future follow-up appointments  Patient education given on each of the above topics.  Patient acknowledges understanding via teach back method and acceptance of all instructions.  Education Materials:  Living Better With Heart Failure Booklet, HF zone tool, & Daily Weight Tracker Tool.  Patient has scale at home: Yes Patient has pill box at home: No.  Per pt only takes 1 medication-BP med prior to discharge.  High Risk Criteria for Readmission and/or Poor Patient Outcomes: Heart failure hospital admissions (last 6 months): 0  No Show rate: 100% Difficult social situation: Lives alone.  Goes out once a week for groceries.  Sister is in contact with him.  Demonstrates medication adherence: Yes Primary Language: English Literacy level: Reading, Writing, & Comprehension  Barriers of Care:   ETOH daily use.  Drinks 2 oz liquor with breakfast every morning. New HF Diagnosis  Considerations/Referrals:   Referral made to Heart Failure Pharmacist Stewardship: No Referral made to Heart  Failure CSW/NCM TOC: No Referral made to Heart & Vascular TOC clinic: [facility] 04/26/24 @ 3:00 PM  Items for Follow-up on DC/TOC: Needs PCP set up & Labs per D/C Summary Daily Weights-has not been doing these daily. Diet & Fluid Restrictions- Eats a lot of frozen meals.  Does not drink more than 1/2 gallon of fluid total a day.   ETOH use-drinks 2 shots of alcohol with his coffee/breakfast every morning. Continued Heart Failure Education including  HF Medication Education & Test Claim (requested from Stephaine Henry on 04/20/24)  Charmaine Pines, RN, BSN [facility] Heart Failure Navigator Secure Chat Only

## 2024-04-20 NOTE — Telephone Encounter (Signed)
 Patient referred to infusion pharmacy team for ambulatory infusion of IV iron .  Insurance - Medicare A&B  Site of care - [facility] Dx code - I50.2/D50.9 IV Iron  Therapy - Patient received Venofer  300 mg IV x 1 inpatient. Will order Feraheme 510 mg IV x 1  Infusion appointments - Scheduling team will schedule patient as soon as possible.    Kennieth Plotts D. Levert Heslop, PharmD

## 2024-04-20 NOTE — Progress Notes (Signed)
 Mobility Specialist Progress Note:    04/20/24 0828  Mobility  Activity Refused and notified nurse if applicable   Pt politely refused mobility d/t fatigue and SOB from previous ambulation to bathroom. SpO2 94% on RA while supine. Will attempt at a later time, all needs met.   Sherrilee Ditty Mobility Specialist Please contact via Special educational needs teacher or  Rehab office at [phone]

## 2024-04-21 LAB — CULTURE, RESPIRATORY W GRAM STAIN: Culture: NORMAL

## 2024-04-23 LAB — CULTURE, BLOOD (ROUTINE X 2)
Culture: NO GROWTH
Culture: NO GROWTH

## 2024-04-25 ENCOUNTER — Telehealth: Payer: Self-pay | Admitting: Family

## 2024-04-25 NOTE — Telephone Encounter (Signed)
 Called to confirm/remind patient of their appointment at the [facility] on 04/26/24.   Appointment:   [] Confirmed  [x] Left mess   [] No answer/No voice mail  [] VM Full/unable to leave message  [] Phone not in service  Patient reminded to bring all medications and/or complete list.  Confirmed patient has transportation. Gave directions, instructed to utilize valet parking.

## 2024-04-26 ENCOUNTER — Ambulatory Visit
Admission: RE | Admit: 2024-04-26 | Discharge: 2024-04-26 | Disposition: A | Discharge status: Home / Self Care | Source: Ambulatory Visit | Attending: Obstetrics and Gynecology | Admitting: Obstetrics and Gynecology

## 2024-04-26 ENCOUNTER — Encounter: Payer: Self-pay | Admitting: Family

## 2024-04-26 ENCOUNTER — Other Ambulatory Visit: Payer: Self-pay

## 2024-04-26 ENCOUNTER — Other Ambulatory Visit
Admission: RE | Admit: 2024-04-26 | Discharge: 2024-04-26 | Disposition: A | Discharge status: Home / Self Care | Source: Ambulatory Visit | Attending: Obstetrics and Gynecology | Admitting: Obstetrics and Gynecology

## 2024-04-26 ENCOUNTER — Ambulatory Visit: Payer: Self-pay | Admitting: Family

## 2024-04-26 ENCOUNTER — Ambulatory Visit: Admitting: Family

## 2024-04-26 VITALS — BP 142/74 | HR 80 | Wt 221.0 lb

## 2024-04-26 DIAGNOSIS — F1090 Alcohol use, unspecified, uncomplicated: Secondary | ICD-10-CM | POA: Diagnosis not present

## 2024-04-26 DIAGNOSIS — Z9221 Personal history of antineoplastic chemotherapy: Secondary | ICD-10-CM | POA: Insufficient documentation

## 2024-04-26 DIAGNOSIS — I10 Essential (primary) hypertension: Secondary | ICD-10-CM | POA: Diagnosis not present

## 2024-04-26 DIAGNOSIS — E785 Hyperlipidemia, unspecified: Secondary | ICD-10-CM | POA: Insufficient documentation

## 2024-04-26 DIAGNOSIS — J90 Pleural effusion, not elsewhere classified: Secondary | ICD-10-CM | POA: Insufficient documentation

## 2024-04-26 DIAGNOSIS — Z602 Problems related to living alone: Secondary | ICD-10-CM | POA: Diagnosis not present

## 2024-04-26 DIAGNOSIS — I5020 Unspecified systolic (congestive) heart failure: Secondary | ICD-10-CM

## 2024-04-26 DIAGNOSIS — E782 Mixed hyperlipidemia: Secondary | ICD-10-CM

## 2024-04-26 DIAGNOSIS — I110 Hypertensive heart disease with heart failure: Secondary | ICD-10-CM | POA: Diagnosis present

## 2024-04-26 DIAGNOSIS — Z604 Social exclusion and rejection: Secondary | ICD-10-CM | POA: Insufficient documentation

## 2024-04-26 DIAGNOSIS — E871 Hypo-osmolality and hyponatremia: Secondary | ICD-10-CM | POA: Insufficient documentation

## 2024-04-26 DIAGNOSIS — Z87891 Personal history of nicotine dependence: Secondary | ICD-10-CM | POA: Insufficient documentation

## 2024-04-26 DIAGNOSIS — I5022 Chronic systolic (congestive) heart failure: Secondary | ICD-10-CM | POA: Diagnosis not present

## 2024-04-26 DIAGNOSIS — D509 Iron deficiency anemia, unspecified: Secondary | ICD-10-CM

## 2024-04-26 DIAGNOSIS — Z9049 Acquired absence of other specified parts of digestive tract: Secondary | ICD-10-CM | POA: Insufficient documentation

## 2024-04-26 DIAGNOSIS — R7989 Other specified abnormal findings of blood chemistry: Secondary | ICD-10-CM | POA: Insufficient documentation

## 2024-04-26 DIAGNOSIS — Z85038 Personal history of other malignant neoplasm of large intestine: Secondary | ICD-10-CM | POA: Diagnosis not present

## 2024-04-26 DIAGNOSIS — F17290 Nicotine dependence, other tobacco product, uncomplicated: Secondary | ICD-10-CM | POA: Diagnosis not present

## 2024-04-26 DIAGNOSIS — Z79899 Other long term (current) drug therapy: Secondary | ICD-10-CM | POA: Insufficient documentation

## 2024-04-26 DIAGNOSIS — R739 Hyperglycemia, unspecified: Secondary | ICD-10-CM | POA: Insufficient documentation

## 2024-04-26 LAB — CBC
HCT: 35.6 % — ABNORMAL LOW (ref 39.0–52.0)
Hemoglobin: 10.9 g/dL — ABNORMAL LOW (ref 13.0–17.0)
MCH: 25.3 pg — ABNORMAL LOW (ref 26.0–34.0)
MCHC: 30.6 g/dL (ref 30.0–36.0)
MCV: 82.8 fL (ref 80.0–100.0)
Platelets: 352 K/uL (ref 150–400)
RBC: 4.3 MIL/uL (ref 4.22–5.81)
RDW: 18.9 % — ABNORMAL HIGH (ref 11.5–15.5)
WBC: 7.6 K/uL (ref 4.0–10.5)
nRBC: 0 % (ref 0.0–0.2)

## 2024-04-26 LAB — BASIC METABOLIC PANEL WITH GFR
Anion gap: 11 (ref 5–15)
BUN: 8 mg/dL (ref 8–23)
CO2: 27 mmol/L (ref 22–32)
Calcium: 9.3 mg/dL (ref 8.9–10.3)
Chloride: 99 mmol/L (ref 98–111)
Creatinine, Ser: 1.09 mg/dL (ref 0.61–1.24)
GFR, Estimated: >60 mL/min (ref >60)
Glucose, Bld: 110 mg/dL — ABNORMAL HIGH (ref 70–99)
Potassium: 3.4 mmol/L — ABNORMAL LOW (ref 3.5–5.1)
Sodium: 137 mmol/L (ref 135–145)

## 2024-04-26 MED ORDER — SODIUM CHLORIDE 0.9 % IV SOLN
510.0000 mg | Freq: Once | INTRAVENOUS | Status: DC
  Filled 2024-04-26: qty 17

## 2024-04-26 MED ORDER — VALSARTAN 40 MG PO TABS
40.0000 mg | ORAL_TABLET | Freq: Every day | ORAL | 3 refills | Status: AC
  Filled 2024-04-26: qty 90, 90d supply, fill #0
  Filled 2024-05-16 – 2024-07-24 (×2): qty 90, 90d supply, fill #1

## 2024-04-26 MED ORDER — SODIUM CHLORIDE 0.9 % IV SOLN
510.0000 mg | Freq: Once | INTRAVENOUS | Status: AC
  Administered 2024-04-26: 510 mg via INTRAVENOUS
  Filled 2024-04-26: qty 17

## 2024-04-26 NOTE — Progress Notes (Unsigned)
 Advanced [facility] Failure Clinic Note   Referring Physician:08/25 admission PCP: Pcp, No Cardiologist: None   Chief Complaint: shortness of breath   HPI:  David Merritt is a 83 y/o male with a history of hyperlipidemia, HTN, IDA, HFrEF (08/25), alcohol use, pleural effusion, preDM, GERD, depression, former smoker, colon cancer s/p partial colectomy w/ chemotherapy.   Admitted 04/18/24 with cough and SOB along with chest pressure. Oxygen desaturation to 88% on room air, which improved to 94% on 6 L oxygen. Infiltrates seen on CT though no fever or leukocytosis or elevated procal, respiratory testing negative. Patient was treated with antibiotics. CT showed pleural effusion, thoracentesis ordered but they were too small to tap. BNP elevated. Echo 04/19/24: EF 35-40%, normal RV, no valvular disease. IV diuresed. Antibiotics given for possible pneumonia. IV iron  given prior to discharge and then an infusion as an outpatient. CTA was negative for PE. A1c done due to elevated glucose. Hyponatremia resolved with diuresis.   He presents today for his initial TOC HF visit with a chief complaint of shortness of breath. Has associated fatigue, occasional palpitations, dizziness. Sleeping well on 1 pillow. Confirms that he's not taking losartan  as he says that he only had 3 days worth of this medication.   No tobacco , 2.5 ounces of bourbon every morning at breakfast (was drinking 1 pint daily). Has been drinking alcohol for all my life. Does not add additional salt but does eat frozen dinners, soups for convenience as he lives alone. Says that his scales broke and he doesn't have the means to get new ones.    Review of Systems: [y] = yes, [ ]  = no   General: Weight gain [ ] ; Weight loss [ ] ; Anorexia [ ] ; Fatigue [ y]; Fever [ ] ; Chills [ ] ; Weakness [ ]   Cardiac: Chest pain/pressure [ ] ; Resting SOB [ ] ; Exertional SOB [ y]; Orthopnea [ ] ; Pedal Edema [ ] ; Palpitations Davis.Dad ]; Syncope [ ] ; Presyncope [ ] ;  Paroxysmal nocturnal dyspnea[ ]   Pulmonary: Cough [ ] ; Wheezing[ ] ; Hemoptysis[ ] ; Sputum [ ] ; Snoring [ ]   GI: Vomiting[ ] ; Dysphagia[ ] ; Melena[ ] ; Hematochezia [ ] ; Heartburn[ ] ; Abdominal pain [ ] ; Constipation [ ] ; Diarrhea [ ] ; BRBPR [ ]   GU: Hematuria[ ] ; Dysuria [ ] ; Nocturia[ ]   Vascular: Pain in legs with walking [ ] ; Pain in feet with lying flat [ ] ; Non-healing sores [ ] ; Stroke [ ] ; TIA [ ] ; Slurred speech [ ] ;  Neuro: Dizziness[y ]; Vertigo[ ] ; Seizures[ ] ; Paresthesias[ ] ;Blurred vision [ ] ; Diplopia [ ] ; Vision changes [ ]   Ortho/Skin: Arthritis [ ] ; Joint pain [ ] ; Muscle pain [ ] ; Joint swelling [ ] ; Back Pain [ ] ; Rash [ ]   Psych: Depression[ ] ; Anxiety[ ]   Heme: Bleeding problems [ ] ; Clotting disorders [ ] ; Anemia Davis.Dad ]  Endocrine: Diabetes [ ] ; Thyroid dysfunction[ ]    Past Medical History:  Diagnosis Date   Colon cancer (HCC)    HLD (hyperlipidemia)    Hypertension    Normocytic anemia    Obesity (BMI 30-39.9)     Current Outpatient Medications  Medication Sig Dispense Refill   carvedilol  (COREG ) 3.125 MG tablet Take 1 tablet (3.125 mg total) by mouth 2 (two) times daily. 60 tablet 1   dapagliflozin  propanediol (FARXIGA ) 5 MG TABS tablet Take 1 tablet (5 mg total) by mouth daily before breakfast. 30 tablet 1   furosemide  (LASIX ) 20 MG tablet Take 1 tablet (20 mg total)  by mouth daily. 30 tablet 1   losartan  (COZAAR ) 25 MG tablet Take 1 tablet (25 mg total) by mouth daily. (Patient not taking: Reported on 04/26/2024) 30 tablet 1   No current facility-administered medications for this visit.    Allergies  Allergen Reactions   Iodinated Contrast Media Other (See Comments)    Pt unsure if this is correct      Social History   Socioeconomic History   Marital status: Single    Spouse name: Not on file   Number of children: 2   Years of education: Not on file   Highest education level: GED or equivalent  Occupational History   Not on file  Tobacco Use    Smoking status: Former    Types: Cigarettes, Pipe   Smokeless tobacco: Never  Substance and Sexual Activity   Alcohol use: Yes    Alcohol/week: 14.0 standard drinks of alcohol    Types: 14 Shots of liquor per week   Drug use: Never   Sexual activity: Not on file  Other Topics Concern   Not on file  Social History Narrative   Not on file   Social Drivers of Health   Financial Resource Strain: Low Risk  (04/20/2024)   Overall Financial Resource Strain (CARDIA)    Difficulty of Paying Living Expenses: Not hard at all  Food Insecurity: No Food Insecurity (04/19/2024)   Hunger Vital Sign    Worried About Running Out of Food in the Last Year: Never true    Ran Out of Food in the Last Year: Never true  Transportation Needs: No Transportation Needs (04/19/2024)   PRAPARE - Administrator, Civil Service (Medical): No    Lack of Transportation (Non-Medical): No  Physical Activity: Not on file  Stress: Not on file  Social Connections: Socially Isolated (04/19/2024)   Social Connection and Isolation Panel    Frequency of Communication with Friends and Family: Once a week    Frequency of Social Gatherings with Friends and Family: Once a week    Attends Religious Services: Never    Database administrator or Organizations: No    Attends Banker Meetings: Never    Marital Status: Divorced  Catering manager Violence: Not At Risk (04/19/2024)   Humiliation, Afraid, Rape, and Kick questionnaire    Fear of Current or Ex-Partner: No    Emotionally Abused: No    Physically Abused: No    Sexually Abused: No      Family History  Problem Relation Age of Onset   CAD Sister    Heart disease Sister    Vitals:   04/26/24 1430  BP: (!) 142/74  Pulse: 80  SpO2: 95%  Weight: 221 lb (100.2 kg)   Wt Readings from Last 3 Encounters:  04/26/24 221 lb (100.2 kg)  04/20/24 226 lb 10.1 oz (102.8 kg)   Lab Results  Component Value Date   CREATININE 1.09 04/26/2024    CREATININE 0.99 04/20/2024   CREATININE 0.79 04/19/2024    PHYSICAL EXAM:  General: Well appearing.  Cor: No JVD. Regular rhythm, rate.  Lungs: clear Abdomen: soft, nontender, nondistended. Extremities: no edema Neuro:. Affect pleasant   ECG: not done   ASSESSMENT & PLAN:  1: HFrEF- newly diagnosed 08/25 - unclear etiology, suspect long-term alcohol use - NYHA class II - euvolemic - scales provided today and parameters to call about discussed - Echo 04/19/24: EF 35-40%, normal RV, no valvular disease. - continue carvedilol  3.125mg   BID - continue farxgia 5mg  daily  - continue furosemide  20mg  daily - begin valsartan  40mg  daily.  - consider MRA next visit - not adding salt but does eat frozen meals/ soups. Is aware of high sodium content of those foods and tries to make least sodium choices as he can - BMET today, repeat at next visit - BNP 04/18/24 was 592.3  2: HTN- - BP 142/74. Starting valsartan  per above - no PCP. Arlyss PCP options provided to him today and emphasized that he get established with one of them - BMET 04/20/24 reviewed: sodium 133, potassium 4.1, creatinine 0.99 & GFR >60. BMET today  3: HLD- - LDL 04/19/24 was 132 - he is hesitant to start too many meds so will revisit this at next visit  4: IDA- - received iron  infusion during recent admission and, again, today - ferritin low at 13 on 04/18/24 - Hg 04/19/24 was 9.8 - CBC today - PCP options provided to patient today  5: Alcohol use- - drinks 2.5 ounces of bourbon every morning with breakfast. Has been drinking his whole life - used to drink a pint a day - reviewed the effects of alcohol use with the patient and encouraged him to work towards cessation. Not terribly interested at this time   Return in 1 month, sooner if needed.   Ellouise DELENA Class, FNP 04/26/24  Spent >30 minutes reviewing the chart, discussing plan with patient and documentating.

## 2024-04-26 NOTE — Patient Instructions (Addendum)
 Medication Changes:  STOP LOSARTAN   START VALSARTAN  40 MG ONCE DAILY   Lab Work:  Go over to the [facility]. Go pass the gift shop and have your blood work completed.  We will only call you if the results are abnormal or if the provider would like to make medication changes.  No news is good news.   Special Instructions // Education:  Begin weighing daily and call for an overnight weight gain of 3 pounds or more or a weekly weight gain of more than 5 pounds.  [facility] [address] [place] [phone]  [facility] [address] [place] [phone]   Follow-Up in: 1 month with David Class, FNP.   Thank you for choosing [facility].    At the [facility], you and your health needs are our priority. We have a designated team specialized in the treatment of Heart Failure. This Care Team includes your primary Heart Failure Specialized Cardiologist (physician), Advanced Practice Providers (APPs- Physician Assistants and Nurse Practitioners), and Pharmacist who all work together to provide you with the care you need, when you need it.   You may see any of the following providers on your designated Care Team at your next follow up:  Dr. Toribio Fuel Dr. Ezra Shuck Dr. Ria Commander Dr. Morene Brownie David Class, FNP David Merritt, RPH-CPP  Please be sure to bring in all your medications bottles to every appointment.   Need to Contact Us :  If you have any questions or concerns before your next appointment please send us  a message through [place] or call our office at [phone].    TO LEAVE A MESSAGE FOR THE NURSE SELECT OPTION 2, PLEASE LEAVE A MESSAGE INCLUDING: YOUR NAME DATE OF BIRTH CALL BACK NUMBER REASON FOR CALL**this is important as we prioritize the call backs  YOU WILL RECEIVE A CALL BACK THE SAME DAY AS LONG AS YOU CALL BEFORE 4:00 PM

## 2024-04-27 ENCOUNTER — Encounter: Payer: Self-pay | Admitting: Family

## 2024-05-16 ENCOUNTER — Other Ambulatory Visit: Payer: Self-pay

## 2024-05-22 ENCOUNTER — Ambulatory Visit (INDEPENDENT_AMBULATORY_CARE_PROVIDER_SITE_OTHER)

## 2024-05-22 VITALS — BP 158/80 | HR 90 | Ht 70.0 in | Wt 212.6 lb

## 2024-05-22 DIAGNOSIS — I10 Essential (primary) hypertension: Secondary | ICD-10-CM

## 2024-05-22 DIAGNOSIS — I6523 Occlusion and stenosis of bilateral carotid arteries: Secondary | ICD-10-CM

## 2024-05-22 DIAGNOSIS — R799 Abnormal finding of blood chemistry, unspecified: Secondary | ICD-10-CM

## 2024-05-22 DIAGNOSIS — R634 Abnormal weight loss: Secondary | ICD-10-CM | POA: Insufficient documentation

## 2024-05-22 DIAGNOSIS — I5020 Unspecified systolic (congestive) heart failure: Secondary | ICD-10-CM | POA: Diagnosis not present

## 2024-05-22 DIAGNOSIS — L299 Pruritus, unspecified: Secondary | ICD-10-CM

## 2024-05-22 DIAGNOSIS — D509 Iron deficiency anemia, unspecified: Secondary | ICD-10-CM

## 2024-05-22 NOTE — Progress Notes (Signed)
 New Patient Visit   Physician: Tamea Bai A Kharter Brew, MD  Patient: David Merritt   DOB: [birth year]   83 y.o. Male  MRN: [id number] Visit Date: 05/22/2024   Chief Complaint  Patient presents with   Establish Care    Concerned about stiff neck.   Subjective  David Merritt is a 83 y.o. male who presents today as a new patient to establish care.   HPI  Discussed the use of AI scribe software for clinical note transcription with the patient, who gave verbal consent to proceed.  History of Present Illness        Patient seen to establish care.  He was more recently admitted to the hospital with cough and shortness of breath.  Found to h have bilateral infiltrates and pleural effusion on chest CT patient presenting with also new systolic heart failure with EF of 35 to 40% Patient treated in hospital with IV Lasix .  He has been started on dapagliflozin  5 mg daily, carvedilol  3.125 mg daily, furosemide  20 mg daily and valsartan  40 mg daily.  Patient treated with IV antibiotic therapy.  He was discharged around August 25.  Patient denies cough chest pain shortness of breath.  No symptoms of orthopnea.  Patient since hospitalization reports feeling well.  He has lost about 10 pounds since hospitalization and weight is continuing to drop.  He otherwise feels fairly well.  He does not routinely check his blood pressure at home..  Patient reporting blood pressure at home is well-controlled. Patient has been compliant with blood pressure medication.  History of carotid stenosis.  2015 CT angiogram showing 40% stenosis of proximal right internal carotid and 50% proximal left internal carotid artery.  Patient has a history of hyperlipidemia Patient has declined statins in the past.  Patient has a history of colon cancer for which he was treated with colon resection in 2009.  He was also treated with chemotherapy, agent unknown.  He had a repeat colonoscopy in 2014, but declines any further  intervention.  Last hemoglobin A1c elevated at 6.3.  He is due for repeat lab.  Patient does live alone and does cook for himself.  He is having significant issues with his dentition which is interfering with his ability to obtain adequate nutrition.  Otherwise history of osteoarthritis for which he uses ibuprofen and Tylenol  as needed.  He has a history of anemia and iron  deficiency.   Patient declines influenza vaccine due to severe reaction in the past.    ASSESSMENT & PLAN  Encounter Diagnoses  Name Primary?   Primary hypertension Yes   Abnormal finding of blood chemistry, unspecified    Carotid stenosis, bilateral    HFrEF (heart failure with reduced ejection fraction) (HCC)    Iron  deficiency anemia, unspecified iron  deficiency anemia type     Orders Placed This Encounter  Procedures   CBC with Differential/Platelet   Comprehensive metabolic panel with GFR   Hemoglobin A1c   Lipid panel   Urinalysis, Routine w reflex microscopic   Iron , TIBC and Ferritin Panel    Assessment and Plan     #1 congestive heart failure with reduced ejection fraction.  Patient will need further follow-up for this in the future.  Current medications adequate for the time being.  Will have him to continue with dapagliflozin , furosemide  20 mg daily.  Basic labs CMP/CBC  2.  History of iron  deficiency anemia.  Will check CBC and iron  levels today.  3.  More recent  hospitalization with pneumonia and respiratory failure.  Patient is stable lungs are clear today and he seems to be feeling well.  His weight is an issue and needs further monitoring.  Discussed higher calorie foods.  More importantly he needs resolution of issues with his dentition which is likely contributing to his iron  deficiency.  Will refer for help here.  4.  Hypertension.  K potation to continue with valsartan  and carvedilol .  Keep blood pressure log at follow-up.  Discussed with patient alcohol intake.  He used to drink shots in  the morning but has discontinued.  Advised not to resume.  5.  History of colon cancer.  Is higher risk overall he does not want any further interventions.    Fu in 2 weeks to review labs    Objective  BP (!) 158/80 (BP Location: Left Arm, Patient Position: Sitting, Cuff Size: Normal)   Pulse 90   Ht 5' 10 (1.778 m)   Wt 212 lb 9.6 oz (96.4 kg)   SpO2 97%   BMI 30.50 kg/m      Review of Systems  Constitutional:  Negative for chills, fever and weight loss.  Eyes:  Negative for blurred vision. h Respiratory:  Negative for cough and shortness of breath.   Cardiovascular:  Negative for chest pain and palpitations.  Skin:  Negative for rash.  Psychiatric/Behavioral:  Negative for depression. The patient is not nervous/anxious.      Physical Exam Physical Exam Vitals reviewed.  Constitutional:      Appearance: Normal appearance. Well-developed with normal weight.  HENT:     Head: Normocephalic and atraumatic.  Normal mucous membranes, no oral lesions Eyes:     Pupils: Pupils are equal, round, and reactive to light.  Neck:     Thyroid: No thyroid mass or thyromegaly.  Cardiovascular:     Rate and Rhythm: Normal rate and regular rhythm. Normal heart sounds. Normal peripheral pulses Pulmonary:     Normal breath sounds with normal effort Abdominal:   Abdomen is soft, without tenderness or noted hepatosplenomegaly Musculoskeletal:        General: No swelling or edema  Lymphadenopathy:     Cervical: No cervical adenopathy.  Skin:    General: Skin is warm and dry without noticeable rash. Neurological:     General: No focal deficit present.  Psychiatric:        Mood and Affect: Mood, behavior and cognition normal   Past Medical History:  Diagnosis Date   Allergy    Colon cancer (HCC)    Hypertension    Obesity (BMI 30-39.9)    Past Surgical History:  Procedure Laterality Date   Partial colectomy due to colon cancer     Family Status  Relation Name Status    Mother  Deceased   Father  Deceased   Sister  (Not Specified)   Daughter  Alive  No partnership data on file   Family History  Problem Relation Age of Onset   Clotting disorder Mother    Heart attack Father    CAD Sister    Heart disease Sister    Drug abuse Daughter    Social History   Socioeconomic History   Marital status: Divorced    Spouse name: Not on file   Number of children: 2   Years of education: Not on file   Highest education level: GED or equivalent  Occupational History   Not on file  Tobacco Use   Smoking status: Former  Types: Cigarettes, Pipe   Smokeless tobacco: Never  Vaping Use   Vaping status: Never Used  Substance and Sexual Activity   Alcohol use: Yes    Alcohol/week: 14.0 standard drinks of alcohol    Types: 14 Shots of liquor per week   Drug use: Never   Sexual activity: Not on file  Other Topics Concern   Not on file  Social History Narrative   Not on file   Social Drivers of Health   Financial Resource Strain: Low Risk  (04/20/2024)   Overall Financial Resource Strain (CARDIA)    Difficulty of Paying Living Expenses: Not hard at all  Food Insecurity: No Food Insecurity (04/19/2024)   Hunger Vital Sign    Worried About Running Out of Food in the Last Year: Never true    Ran Out of Food in the Last Year: Never true  Transportation Needs: No Transportation Needs (04/19/2024)   PRAPARE - Administrator, Civil Service (Medical): No    Lack of Transportation (Non-Medical): No  Physical Activity: Not on file  Stress: Not on file  Social Connections: Socially Isolated (04/19/2024)   Social Connection and Isolation Panel    Frequency of Communication with Friends and Family: Once a week    Frequency of Social Gatherings with Friends and Family: Once a week    Attends Religious Services: Never    Database administrator or Organizations: No    Attends Banker Meetings: Never    Marital Status: Divorced    Outpatient Medications Prior to Visit  Medication Sig   carvedilol  (COREG ) 3.125 MG tablet Take 1 tablet (3.125 mg total) by mouth 2 (two) times daily.   dapagliflozin  propanediol (FARXIGA ) 5 MG TABS tablet Take 1 tablet (5 mg total) by mouth daily before breakfast.   furosemide  (LASIX ) 20 MG tablet Take 1 tablet (20 mg total) by mouth daily.   valsartan  (DIOVAN ) 40 MG tablet Take 1 tablet (40 mg total) by mouth daily.   No facility-administered medications prior to visit.   Allergies  Allergen Reactions   Iodinated Contrast Media Other (See Comments)    Pt unsure if this is correct     There is no immunization history on file for this patient.  Health Maintenance  Topic Date Due   Medicare Annual Wellness (AWV)  Never done   DTaP/Tdap/Td (1 - Tdap) Never done   Pneumococcal Vaccine: 50+ Years (1 of 2 - PCV) Never done   Zoster Vaccines- Shingrix (1 of 2) Never done   Influenza Vaccine  03/24/2024   COVID-19 Vaccine (1 - 2024-25 season) Never done   HPV VACCINES  Aged Out   Meningococcal B Vaccine  Aged Out    Patient Care Team: Pcp, No as PCP - General  Depression Screen     No data to display           Parris DELENA Juneau, MD  [facility] [phone] (phone) [phone] (fax)  [facility]       Q

## 2024-05-23 ENCOUNTER — Other Ambulatory Visit: Payer: Self-pay

## 2024-05-23 LAB — URINALYSIS, ROUTINE W REFLEX MICROSCOPIC
Bilirubin Urine: NEGATIVE
Hgb urine dipstick: NEGATIVE
Ketones, ur: NEGATIVE
Leukocytes,Ua: NEGATIVE
Nitrite: NEGATIVE
Protein, ur: NEGATIVE
Specific Gravity, Urine: 1.008 (ref 1.001–1.035)
pH: 5.5 (ref 5.0–8.0)

## 2024-05-23 LAB — CBC WITH DIFFERENTIAL/PLATELET
Absolute Lymphocytes: 1796 {cells}/uL (ref 850–3900)
Absolute Monocytes: 603 {cells}/uL (ref 200–950)
Basophils Absolute: 40 {cells}/uL (ref 0–200)
Basophils Relative: 0.6 %
Eosinophils Absolute: 188 {cells}/uL (ref 15–500)
Eosinophils Relative: 2.8 %
HCT: 43.4 % (ref 38.5–50.0)
Hemoglobin: 13.7 g/dL (ref 13.2–17.1)
MCH: 26.9 pg — ABNORMAL LOW (ref 27.0–33.0)
MCHC: 31.6 g/dL — ABNORMAL LOW (ref 32.0–36.0)
MCV: 85.3 fL (ref 80.0–100.0)
MPV: 9.5 fL (ref 7.5–12.5)
Monocytes Relative: 9 %
Neutro Abs: 4074 {cells}/uL (ref 1500–7800)
Neutrophils Relative %: 60.8 %
Platelets: 357 Thousand/uL (ref 140–400)
RBC: 5.09 Million/uL (ref 4.20–5.80)
RDW: 17 % — ABNORMAL HIGH (ref 11.0–15.0)
Total Lymphocyte: 26.8 %
WBC: 6.7 Thousand/uL (ref 3.8–10.8)

## 2024-05-23 LAB — IRON,TIBC AND FERRITIN PANEL
%SAT: 16 % — ABNORMAL LOW (ref 20–48)
Ferritin: 71 ng/mL (ref 24–380)
Iron: 56 ug/dL (ref 50–180)
TIBC: 346 ug/dL (ref 250–425)

## 2024-05-23 LAB — LIPID PANEL
Cholesterol: 219 mg/dL — ABNORMAL HIGH (ref <200)
HDL: 39 mg/dL — ABNORMAL LOW (ref >=40)
LDL Cholesterol (Calc): 142 mg/dL — ABNORMAL HIGH
Non-HDL Cholesterol (Calc): 180 mg/dL — ABNORMAL HIGH (ref <130)
Total CHOL/HDL Ratio: 5.6 (calc) — ABNORMAL HIGH (ref <5.0)
Triglycerides: 234 mg/dL — ABNORMAL HIGH (ref <150)

## 2024-05-23 LAB — COMPREHENSIVE METABOLIC PANEL WITH GFR
AG Ratio: 1.3 (calc) (ref 1.0–2.5)
ALT: 20 U/L (ref 9–46)
AST: 20 U/L (ref 10–35)
Albumin: 4.4 g/dL (ref 3.6–5.1)
Alkaline phosphatase (APISO): 71 U/L (ref 35–144)
BUN: 11 mg/dL (ref 7–25)
CO2: 29 mmol/L (ref 20–32)
Calcium: 9.3 mg/dL (ref 8.6–10.3)
Chloride: 98 mmol/L (ref 98–110)
Creat: 1.03 mg/dL (ref 0.70–1.22)
Globulin: 3.4 g/dL (ref 1.9–3.7)
Glucose, Bld: 102 mg/dL — ABNORMAL HIGH (ref 65–99)
Potassium: 4.5 mmol/L (ref 3.5–5.3)
Sodium: 135 mmol/L (ref 135–146)
Total Bilirubin: 0.4 mg/dL (ref 0.2–1.2)
Total Protein: 7.8 g/dL (ref 6.1–8.1)
eGFR: 72 mL/min/1.73m2 (ref >=60)

## 2024-05-23 LAB — HEMOGLOBIN A1C
Hgb A1c MFr Bld: 6 % — ABNORMAL HIGH (ref <5.7)
Mean Plasma Glucose: 126 mg/dL
eAG (mmol/L): 7 mmol/L

## 2024-05-23 MED ORDER — TRIAMCINOLONE ACETONIDE 0.1 % EX CREA
1.0000 | TOPICAL_CREAM | Freq: Two times a day (BID) | CUTANEOUS | 0 refills | Status: AC
  Filled 2024-05-23: qty 15, 30d supply, fill #0

## 2024-05-23 NOTE — Addendum Note (Signed)
 Addended by: Mellany Dinsmore A on: 05/23/2024 10:58 AM   Modules accepted: Orders

## 2024-05-24 ENCOUNTER — Telehealth: Payer: Self-pay

## 2024-05-24 NOTE — Progress Notes (Signed)
 Complex Care Management Note  Care Guide Note 05/24/2024 Name: David Merritt MRN: [id number] DOB: [birth year]  David Merritt is a 83 y.o. year old male who sees Zafirov, Parris LABOR, MD for primary care. I reached out to David Merritt by phone today to offer complex care management services.  David Merritt was given information about Complex Care Management services today including:   The Complex Care Management services include support from the care team which includes your Nurse Care Manager, Clinical Social Worker, or Pharmacist.  The Complex Care Management team is here to help remove barriers to the health concerns and goals most important to you. Complex Care Management services are voluntary, and the patient may decline or stop services at any time by request to their care team member.   Complex Care Management Consent Status: Patient agreed to services and verbal consent obtained.   Follow up plan:  Telephone appointment with complex care management team member scheduled for:  BSW 05/25/2024 RNCM 06/13/2024  Encounter Outcome:  Patient Scheduled  David Merritt , RMA     [place]  [facility], [facility] Guide  Direct Dial: [phone]  Website: [email or web address]

## 2024-05-25 ENCOUNTER — Other Ambulatory Visit: Payer: Self-pay

## 2024-05-25 NOTE — Patient Outreach (Signed)
 Complex Care Management   Visit Note  05/25/2024  Name:  ADRIENNE DELAY MRN: [id number] DOB: [birth year]  Situation: Referral received for Complex Care Management related to eye and dental resources I obtained verbal consent from Patient.  Visit completed with Patient  on the phone  Background:   Past Medical History:  Diagnosis Date   Allergy    Colon cancer (HCC)    Hypertension    Obesity (BMI 30-39.9)     Assessment: SW completed a telephoen outreach with patient, he states dental and eye resources are needed. Patient requested resources for [employer] only. Patient states she does receive social security and has only Medicare. SW and patient agreed for eye and dental resources to be mailed. Patient denies any SDOH needs at this time.  SDOH Interventions    Flowsheet Row ED to Hosp-Admission (Discharged) from 04/18/2024 in [facility] PCU  SDOH Interventions   Financial Strain Interventions Intervention Not Indicated    Recommendation:   No recommendations at this time  Follow Up Plan:   Telephone follow-up on 06/15/24 at 3pm  Thersia Hoar, BSW, [facility]  [facility] Social Worker, Population Health [phone]

## 2024-05-25 NOTE — Patient Instructions (Signed)
 Visit Information  Thank you for taking time to visit with me today. Please don't hesitate to contact me if I can be of assistance to you before our next scheduled appointment.  Our next appointment is by telephone on 06/15/24 at 3pm Please call the care guide team at [phone] if you need to cancel or reschedule your appointment.   Following is a copy of your care plan:   Goals Addressed             This Visit's Progress    BSW VBCI Social Work Care Plan       Problems:   Eye and dental resources  CSW Clinical Goal(s):   Over the next 30 days the Patient will will follow up with dental resources  as directed by Social Work.  Interventions:  SW mailed eye and dental resources for [employer].  Patient Goals/Self-Care Activities:  Patient will use resources SW sent to schedule an eye and dental appointment.  Plan:   The care management team will reach out to the patient again over the next 15 days.        Please call the Suicide and Crisis Lifeline: 988 call the [place]  National Suicide Prevention Lifeline: 415-532-1997 or TTY: 802-007-3900 TTY 2362527786) to talk to a trained counselor call 1-800-273-TALK (toll free, 24 hour hotline) call 911 if you are experiencing a Mental Health or Behavioral Health Crisis or need someone to talk to.  The patient verbalized understanding of instructions, educational materials, and care plan provided today and DECLINED offer to receive copy of patient instructions, educational materials, and care plan.   Thersia Hoar, HEDWIG, MHA [place]  [facility] Social Worker, Population Health [phone]

## 2024-05-31 ENCOUNTER — Telehealth: Payer: Self-pay | Admitting: Family

## 2024-05-31 NOTE — Telephone Encounter (Signed)
 Called to confirm/remind patient of their appointment at the [facility] on 06/01/24.   Appointment:   [x] Confirmed  [] Left mess   [] No answer/No voice mail  [] VM Full/unable to leave message  [] Phone not in service  Patient reminded to bring all medications and/or complete list.  Confirmed patient has transportation. Gave directions, instructed to utilize valet parking.

## 2024-05-31 NOTE — Progress Notes (Unsigned)
 [facility] Note   Referring Physician:08/25 admission PCP: Zafirov, Clarissa A, MD Cardiologist: None   Chief Complaint: shortness of breath   HPI:  David Merritt is a 83 y/o male with a history of hyperlipidemia, HTN, IDA, HFrEF (08/25), alcohol use, pleural effusion, preDM, GERD, depression, former smoker, colon cancer s/p partial colectomy w/ chemotherapy.   Admitted 04/18/24 with cough and SOB along with chest pressure. Oxygen desaturation to 88% on room air, which improved to 94% on 6 L oxygen. Infiltrates seen on CT though no fever or leukocytosis or elevated procal, respiratory testing negative. Patient was treated with antibiotics. CT showed pleural effusion, thoracentesis ordered but they were too small to tap. BNP elevated. Echo 04/19/24: EF 35-40%, normal RV, no valvular disease. IV diuresed. Antibiotics given for possible pneumonia. IV iron  given prior to discharge and then an infusion as an outpatient. CTA was negative for PE. A1c done due to elevated glucose. Hyponatremia resolved with diuresis.   He presents today for his initial TOC HF visit with a chief complaint of shortness of breath. Has associated fatigue, occasional palpitations, dizziness. Sleeping well on 1 pillow. Confirms that he's not taking losartan  as he says that he only had 3 days worth of this medication.   No tobacco , 2.5 ounces of bourbon every morning at breakfast (was drinking 1 pint daily). Has been drinking alcohol for all my life. Does not add additional salt but does eat frozen dinners, soups for convenience as he lives alone. Says that his scales broke and he doesn't have the means to get new ones.    Review of Systems: [y] = yes, [ ]  = no   General: Weight gain [ ] ; Weight loss [ ] ; Anorexia [ ] ; Fatigue [ y]; Fever [ ] ; Chills [ ] ; Weakness [ ]   Cardiac: Chest pain/pressure [ ] ; Resting SOB [ ] ; Exertional SOB [ y]; Orthopnea [ ] ; Pedal Edema [ ] ; Palpitations Davis.Dad ]; Syncope [ ] ;  Presyncope [ ] ; Paroxysmal nocturnal dyspnea[ ]   Pulmonary: Cough [ ] ; Wheezing[ ] ; Hemoptysis[ ] ; Sputum [ ] ; Snoring [ ]   GI: Vomiting[ ] ; Dysphagia[ ] ; Melena[ ] ; Hematochezia [ ] ; Heartburn[ ] ; Abdominal pain [ ] ; Constipation [ ] ; Diarrhea [ ] ; BRBPR [ ]   GU: Hematuria[ ] ; Dysuria [ ] ; Nocturia[ ]   Vascular: Pain in legs with walking [ ] ; Pain in feet with lying flat [ ] ; Non-healing sores [ ] ; Stroke [ ] ; TIA [ ] ; Slurred speech [ ] ;  Neuro: Dizziness[y ]; Vertigo[ ] ; Seizures[ ] ; Paresthesias[ ] ;Blurred vision [ ] ; Diplopia [ ] ; Vision changes [ ]   Ortho/Skin: Arthritis [ ] ; Joint pain [ ] ; Muscle pain [ ] ; Joint swelling [ ] ; Back Pain [ ] ; Rash [ ]   Psych: Depression[ ] ; Anxiety[ ]   Heme: Bleeding problems [ ] ; Clotting disorders [ ] ; Anemia Davis.Dad ]  Endocrine: Diabetes [ ] ; Thyroid dysfunction[ ]    Past Medical History:  Diagnosis Date   Allergy    Colon cancer (HCC)    Hypertension    Obesity (BMI 30-39.9)     Current Outpatient Medications  Medication Sig Dispense Refill   carvedilol  (COREG ) 3.125 MG tablet Take 1 tablet (3.125 mg total) by mouth 2 (two) times daily. 60 tablet 1   dapagliflozin  propanediol (FARXIGA ) 5 MG TABS tablet Take 1 tablet (5 mg total) by mouth daily before breakfast. 30 tablet 1   furosemide  (LASIX ) 20 MG tablet Take 1 tablet (20 mg total) by mouth daily. 30  tablet 1   valsartan  (DIOVAN ) 40 MG tablet Take 1 tablet (40 mg total) by mouth daily. 90 tablet 3   No current facility-administered medications for this visit.    Allergies  Allergen Reactions   Iodinated Contrast Media Other (See Comments)    Pt unsure if this is correct      Social History   Socioeconomic History   Marital status: Divorced    Spouse name: Not on file   Number of children: 2   Years of education: Not on file   Highest education level: GED or equivalent  Occupational History   Not on file  Tobacco Use   Smoking status: Former    Types: Cigarettes, Pipe    Smokeless tobacco: Never  Vaping Use   Vaping status: Never Used  Substance and Sexual Activity   Alcohol use: Yes    Alcohol/week: 14.0 standard drinks of alcohol    Types: 14 Shots of liquor per week   Drug use: Never   Sexual activity: Not on file  Other Topics Concern   Not on file  Social History Narrative   Not on file   Social Drivers of Health   Financial Resource Strain: Low Risk  (05/25/2024)   Overall Financial Resource Strain (CARDIA)    Difficulty of Paying Living Expenses: Not very hard  Food Insecurity: No Food Insecurity (05/25/2024)   Hunger Vital Sign    Worried About Running Out of Food in the Last Year: Never true    Ran Out of Food in the Last Year: Never true  Transportation Needs: No Transportation Needs (05/25/2024)   PRAPARE - Administrator, Civil Service (Medical): No    Lack of Transportation (Non-Medical): No  Physical Activity: Not on file  Stress: Not on file  Social Connections: Socially Isolated (04/19/2024)   Social Connection and Isolation Panel    Frequency of Communication with Friends and Family: Once a week    Frequency of Social Gatherings with Friends and Family: Once a week    Attends Religious Services: Never    Database administrator or Organizations: No    Attends Banker Meetings: Never    Marital Status: Divorced  Catering manager Violence: Not At Risk (04/19/2024)   Humiliation, Afraid, Rape, and Kick questionnaire    Fear of Current or Ex-Partner: No    Emotionally Abused: No    Physically Abused: No    Sexually Abused: No      Family History  Problem Relation Age of Onset   Clotting disorder Mother    Heart attack Father    CAD Sister    Heart disease Sister    Drug abuse Daughter    There were no vitals filed for this visit.  Wt Readings from Last 3 Encounters:  05/22/24 212 lb 9.6 oz (96.4 kg)  04/26/24 221 lb (100.2 kg)  04/20/24 226 lb 10.1 oz (102.8 kg)   Lab Results  Component  Value Date   CREATININE 1.03 05/22/2024   CREATININE 1.09 04/26/2024   CREATININE 0.99 04/20/2024    PHYSICAL EXAM:  General: Well appearing.  Cor: No JVD. Regular rhythm, rate.  Lungs: clear Abdomen: soft, nontender, nondistended. Extremities: no edema Neuro:. Affect pleasant   ECG: not done   ASSESSMENT & PLAN:  1: HFrEF- newly diagnosed 08/25 - unclear etiology, suspect long-term alcohol use - NYHA class II - euvolemic - scales provided today and parameters to call about discussed - Echo 04/19/24: EF  35-40%, normal RV, no valvular disease. - continue carvedilol  3.125mg  BID - continue farxgia 5mg  daily  - continue furosemide  20mg  daily - begin valsartan  40mg  daily.  - consider MRA next visit - not adding salt but does eat frozen meals/ soups. Is aware of high sodium content of those foods and tries to make least sodium choices as he can - BMET today, repeat at next visit - BNP 04/18/24 was 592.3  2: HTN- - BP 142/74. Starting valsartan  per above - no PCP. Arlyss PCP options provided to him today and emphasized that he get established with one of them - BMET 04/20/24 reviewed: sodium 133, potassium 4.1, creatinine 0.99 & GFR >60. BMET today  3: HLD- - LDL 04/19/24 was 132 - he is hesitant to start too many meds so will revisit this at next visit  4: IDA- - received iron  infusion during recent admission and, again, today - ferritin low at 13 on 04/18/24 - Hg 04/19/24 was 9.8 - CBC today - PCP options provided to patient today  5: Alcohol use- - drinks 2.5 ounces of bourbon every morning with breakfast. Has been drinking his whole life - used to drink a pint a day - reviewed the effects of alcohol use with the patient and encouraged him to work towards cessation. Not terribly interested at this time   Return in 1 month, sooner if needed.   Ellouise DELENA Class, FNP 05/31/24  Spent >30 minutes reviewing the chart, discussing plan with patient and documentating.

## 2024-06-01 ENCOUNTER — Encounter: Payer: Self-pay | Admitting: Family

## 2024-06-01 ENCOUNTER — Other Ambulatory Visit: Payer: Self-pay

## 2024-06-01 ENCOUNTER — Ambulatory Visit: Attending: Family | Admitting: Family

## 2024-06-01 ENCOUNTER — Other Ambulatory Visit (HOSPITAL_COMMUNITY): Payer: Self-pay

## 2024-06-01 VITALS — BP 105/74 | HR 97 | Wt 213.4 lb

## 2024-06-01 DIAGNOSIS — R7303 Prediabetes: Secondary | ICD-10-CM | POA: Insufficient documentation

## 2024-06-01 DIAGNOSIS — I110 Hypertensive heart disease with heart failure: Secondary | ICD-10-CM | POA: Insufficient documentation

## 2024-06-01 DIAGNOSIS — Z85038 Personal history of other malignant neoplasm of large intestine: Secondary | ICD-10-CM | POA: Diagnosis not present

## 2024-06-01 DIAGNOSIS — R0602 Shortness of breath: Secondary | ICD-10-CM | POA: Diagnosis present

## 2024-06-01 DIAGNOSIS — D509 Iron deficiency anemia, unspecified: Secondary | ICD-10-CM | POA: Insufficient documentation

## 2024-06-01 DIAGNOSIS — I5020 Unspecified systolic (congestive) heart failure: Secondary | ICD-10-CM | POA: Diagnosis not present

## 2024-06-01 DIAGNOSIS — Z87891 Personal history of nicotine dependence: Secondary | ICD-10-CM | POA: Insufficient documentation

## 2024-06-01 DIAGNOSIS — Z9221 Personal history of antineoplastic chemotherapy: Secondary | ICD-10-CM | POA: Diagnosis not present

## 2024-06-01 DIAGNOSIS — F1090 Alcohol use, unspecified, uncomplicated: Secondary | ICD-10-CM | POA: Insufficient documentation

## 2024-06-01 DIAGNOSIS — F32A Depression, unspecified: Secondary | ICD-10-CM | POA: Diagnosis not present

## 2024-06-01 DIAGNOSIS — E785 Hyperlipidemia, unspecified: Secondary | ICD-10-CM | POA: Diagnosis not present

## 2024-06-01 DIAGNOSIS — T383X6A Underdosing of insulin and oral hypoglycemic [antidiabetic] drugs, initial encounter: Secondary | ICD-10-CM | POA: Diagnosis not present

## 2024-06-01 DIAGNOSIS — I10 Essential (primary) hypertension: Secondary | ICD-10-CM | POA: Diagnosis not present

## 2024-06-01 DIAGNOSIS — I5022 Chronic systolic (congestive) heart failure: Secondary | ICD-10-CM | POA: Insufficient documentation

## 2024-06-01 DIAGNOSIS — E782 Mixed hyperlipidemia: Secondary | ICD-10-CM

## 2024-06-01 DIAGNOSIS — Z91120 Patient's intentional underdosing of medication regimen due to financial hardship: Secondary | ICD-10-CM | POA: Diagnosis not present

## 2024-06-01 DIAGNOSIS — K219 Gastro-esophageal reflux disease without esophagitis: Secondary | ICD-10-CM | POA: Diagnosis not present

## 2024-06-01 DIAGNOSIS — J90 Pleural effusion, not elsewhere classified: Secondary | ICD-10-CM | POA: Diagnosis not present

## 2024-06-01 MED ORDER — JARDIANCE 10 MG PO TABS
10.0000 mg | ORAL_TABLET | Freq: Every day | ORAL | 11 refills | Status: DC
  Filled 2024-06-01 (×2): qty 30, 30d supply, fill #0

## 2024-06-01 NOTE — Progress Notes (Signed)
 Medication Samples have been provided to the patient.  Drug name: Jardiance        Strength: 10mg         Qty: 4 boxes  LOT: [id number]  Exp.Date: feb 2027  Dosing instructions: take 1 tab daily before breakfast  The patient has been instructed regarding the correct time, dose, and frequency of taking this medication, including desired effects and most common side effects.   David Merritt 3:21 PM 06/01/2024

## 2024-06-01 NOTE — Patient Instructions (Signed)
 It was good to see you today!  If you receive a satisfaction survey regarding the Heart Failure Clinic, please take the time to fill it out. This way we can continue to provide excellent care and make any changes that need to be made.   Medication Changes:  START Jardiance  10mg  (1 tab) daily. You have been given samples of this medication and a voucher while we work on your patient assistance.   Follow-Up in: Please follow up with the [facility] in 3 months with Ellouise Class, FNP.   Thank you for choosing [facility].    At the [facility], you and your health needs are our priority. We have a designated team specialized in the treatment of Heart Failure. This Care Team includes your primary Heart Failure Specialized Cardiologist (physician), Advanced Practice Providers (APPs- Physician Assistants and Nurse Practitioners), and Pharmacist who all work together to provide you with the care you need, when you need it.   You may see any of the following providers on your designated Care Team at your next follow up:  Dr. Toribio Fuel Dr. Ezra Shuck Dr. Ria Commander Dr. Morene Brownie Ellouise Class, FNP Jaun Bash, RPH-CPP  Please be sure to bring in all your medications bottles to every appointment.   Need to Contact Us :  If you have any questions or concerns before your next appointment please send us  a message through [place] or call our office at [phone].    TO LEAVE A MESSAGE FOR THE NURSE SELECT OPTION 2, PLEASE LEAVE A MESSAGE INCLUDING: YOUR NAME DATE OF BIRTH CALL BACK NUMBER REASON FOR CALL**this is important as we prioritize the call backs  YOU WILL RECEIVE A CALL BACK THE SAME DAY AS LONG AS YOU CALL BEFORE 4:00 PM

## 2024-06-02 ENCOUNTER — Telehealth (HOSPITAL_COMMUNITY): Payer: Self-pay | Admitting: Licensed Clinical Social Worker

## 2024-06-02 ENCOUNTER — Other Ambulatory Visit: Payer: Self-pay

## 2024-06-02 NOTE — Telephone Encounter (Signed)
 H&V Care Navigation CSW Progress Note  Clinical Social Worker consulted to speak with pt regarding Medicaid eligibility by pharmacy team.   CSW spoke with pt who reports he gets around $900 from retirement income each month and has no assets or savings.    States he used to get Medicaid but he stopped after he had issues with a recertification.  States he has now decided he is not interested in pursuing again as it frustrated him to recertify each year.  CSW explained that without applying this could affect his eligibility for medication assistance programs and he expressed understanding and continues to state he is unwilling to apply.  CSW discussed pt applying for Medicare part D but this is not affordable for him- discussed potential assistance through [employer] and Medicare Savings- agreeable to mailing out information to him  No further needs at this time- pharmacy advocate updated regarding this conversation  Andriette HILARIO Leech, LCSW Clinical Social Worker [facility] Desk#: [phone] Cell#: [phone]

## 2024-06-12 ENCOUNTER — Ambulatory Visit

## 2024-06-12 NOTE — Progress Notes (Deleted)
            Progress Note  Physician: Carollynn Pennywell A Franz Svec, MD   HPI: David Merritt is a 83 y.o. male presenting on 06/12/2024 for No chief complaint on file. .  Discussed the use of AI scribe software for clinical note transcription with the patient, who gave verbal consent to proceed.  History of Present Illness        David Merritt is a 83 y.o. male with medical history significant of HTN, HLD, pre-DM, GERD, depression, former smoker, anemia, obesity, colon cancer s/p of partial colectomy and chemotherapy)      Medical history:  Relevant past medical, surgical, family and social history reviewed and updated as indicated. Interim medical history since our last visit reviewed.  Allergies and medications reviewed and updated.   ROS: Negative unless specifically indicated above in HPI.    Current Outpatient Medications:  .  carvedilol  (COREG ) 3.125 MG tablet, Take 1 tablet (3.125 mg total) by mouth 2 (two) times daily., Disp: 60 tablet, Rfl: 1 .  furosemide  (LASIX ) 20 MG tablet, Take 1 tablet (20 mg total) by mouth daily., Disp: 30 tablet, Rfl: 1 .  JARDIANCE  10 MG TABS tablet, Take 1 tablet (10 mg total) by mouth daily before breakfast., Disp: 30 tablet, Rfl: 11 .  valsartan  (DIOVAN ) 40 MG tablet, Take 1 tablet (40 mg total) by mouth daily., Disp: 90 tablet, Rfl: 3       Objective:     There were no vitals taken for this visit.  Wt Readings from Last 3 Encounters:  06/01/24 213 lb 6.4 oz (96.8 kg)  05/22/24 212 lb 9.6 oz (96.4 kg)  04/26/24 221 lb (100.2 kg)    Physical Exam  Physical Exam Vitals reviewed.  Constitutional:      Appearance: Normal appearance. Well-developed with normal weight.  Cardiovascular:     Rate and Rhythm: Normal rate and regular rhythm. Normal heart sounds. Normal peripheral pulses Pulmonary:     Normal breath sounds with normal effort Skin:    General: Skin is warm and dry without noticeable rash. Neurological:     General:  No focal deficit present.  Psychiatric:        Mood and Affect: Mood, behavior and cognition normal      Assessment & Plan:  No diagnosis found.  No orders of the defined types were placed in this encounter.    Assessment and Plan

## 2024-06-13 ENCOUNTER — Other Ambulatory Visit: Payer: Self-pay

## 2024-06-13 NOTE — Patient Instructions (Addendum)
 Visit Information  Thank you for taking time to visit with me today. Please don't hesitate to contact me if I can be of assistance to you before our next scheduled appointment.  Our next appointment is by telephone on Monday, November 3rd at 2:30pm. Please call the care guide team at [phone] if you need to cancel or reschedule your appointment.   Following is a copy of your care plan:   Goals Addressed             This Visit's Progress    VBCI RN Care Plan       Problems:  Chronic Disease Management support and education needs related to CHF  Goal: Over the next 3 months the Patient will demonstrate Ongoing adherence to prescribed treatment plan for CHF as evidenced by no shortness of breath, and absence of or reduced edema.   Interventions:   Heart Failure Interventions: Basic overview and discussion of pathophysiology of Heart Failure reviewed Discussed importance of daily weight and advised patient to weigh and record daily Reviewed role of diuretics in prevention of fluid overload and management of heart failure; Discussed the importance of keeping all appointments with provider Screening for signs and symptoms of depression related to chronic disease state  Assessed social determinant of health barriers   Patient Self-Care Activities:  call office if I gain more than 2 pounds in one day or 5 pounds in one week watch for swelling in feet, ankles and legs every day weigh myself daily  Plan:  Telephone follow up appointment with care management team member scheduled for:  two weeks          VBCI RN Care Plan       Problems:  Knowledge Deficits related to Health Maintenance  Goal: Over the next 2 months the Patient will demonstrate Improved health management independence as evidenced by making appointments with vision and dental providers        Over the next 15 days, patient will learn about vision/dental providers and their contact information AEB receiving  list sent in the mail from BSW.  Interventions:   Health Maintenance Interventions: Patient interviewed about adult health maintenance status including  Regular eye checkups Regular Dental Care     Patient Self-Care Activities:  Call provider office for new concerns or questions  Work with the social worker to address care coordination needs and will continue to work with the clinical team to address health care and disease management related needs  Plan:  Telephone follow up appointment with care management team member scheduled for:  two weeks.             Please call the [place]  National Suicide Prevention Lifeline: (607)547-4229 or TTY: (204) 464-2476 TTY (512)880-3787) to talk to a trained counselor if you are experiencing a Mental Health or Behavioral Health Crisis or need someone to talk to.  The patient verbalized understanding of instructions, educational materials, and care plan provided today and agreed to receive a mailed copy of patient instructions, educational materials, and care plan.   Santana Stamp BSN, CCM [place]  VBCI Population Health RN Care Manager Direct Dial: [phone]  Fax: [phone]

## 2024-06-14 ENCOUNTER — Ambulatory Visit (INDEPENDENT_AMBULATORY_CARE_PROVIDER_SITE_OTHER)

## 2024-06-14 VITALS — BP 130/80 | HR 97 | Ht 70.0 in | Wt 211.2 lb

## 2024-06-14 DIAGNOSIS — I5020 Unspecified systolic (congestive) heart failure: Secondary | ICD-10-CM

## 2024-06-14 DIAGNOSIS — I10 Essential (primary) hypertension: Secondary | ICD-10-CM

## 2024-06-14 DIAGNOSIS — R634 Abnormal weight loss: Secondary | ICD-10-CM | POA: Diagnosis not present

## 2024-06-14 DIAGNOSIS — I6523 Occlusion and stenosis of bilateral carotid arteries: Secondary | ICD-10-CM

## 2024-06-14 DIAGNOSIS — D649 Anemia, unspecified: Secondary | ICD-10-CM | POA: Diagnosis not present

## 2024-06-14 DIAGNOSIS — E669 Obesity, unspecified: Secondary | ICD-10-CM

## 2024-06-14 NOTE — Progress Notes (Signed)
 ++++++++++++++++++++++++++++++++++++++++++++++++++++++++++++++++++++++++++++++++++++++++++++/*           Progress Note  Physician: Jabron Weese A Kavir Savoca, MD   HPI: David Merritt is a 83 y.o. male presenting on 06/14/2024 for Follow-up (Patient states he is always dizzy.) .  Discussed the use of AI scribe software for clinical note transcription with the patient, who gave verbal consent to proceed.  History of Present Illness   David Merritt is an 83 year old male with carotid artery stenosis who presents with dizziness and neck stiffness.  Dizziness/vertigo - Dizziness present for approximately one year - Symptoms occur particularly when turning head - Described as a sensation of vertigo which subsides  Carotid stenosis  No recent carotid evaluation; significant carotid artery stenosis noted in 2015 - moderate to severe stenosis  Congestive heart fialure - Occasional shortness of breath, with a recent episode this morning - on furosemide  20 - No chest pain, weight stable, no orthopnea  Weight loss and decreased appetite - Weight loss attributed to decreased appetite and lack of motivation to prepare meals - Diet includes a variety of foods such as [place] sandwiches and frozen dinners  Hypertension - Well controlled with valsartan  40 mg tab and carvedilol  3.125 BID  Pre-diabetes - Borderline diabetic status, not on diabetes medication, does not follow a DM diet.  HgbA1C 6.0 - Family history of diabetes; entire family is diabetic  Alcohol use - Significant reduction in alcohol consumption since last hospitalization - Only one drink and two beers consumed since last hospitalization         Medical history:  Relevant past medical, surgical, family and social history reviewed and updated as indicated. Interim medical history since our last visit reviewed.  Allergies and medications reviewed and updated.   ROS: Negative unless specifically indicated above in HPI.     Current Outpatient Medications:    carvedilol  (COREG ) 3.125 MG tablet, Take 1 tablet (3.125 mg total) by mouth 2 (two) times daily., Disp: 60 tablet, Rfl: 1   furosemide  (LASIX ) 20 MG tablet, Take 1 tablet (20 mg total) by mouth daily., Disp: 30 tablet, Rfl: 1   valsartan  (DIOVAN ) 40 MG tablet, Take 1 tablet (40 mg total) by mouth daily., Disp: 90 tablet, Rfl: 3       Objective:     BP 130/80   Pulse 97   Ht 5' 10 (1.778 m)   Wt 211 lb 3.2 oz (95.8 kg)   SpO2 95%   BMI 30.30 kg/m   Wt Readings from Last 3 Encounters:  06/14/24 211 lb 3.2 oz (95.8 kg)  06/01/24 213 lb 6.4 oz (96.8 kg)  05/22/24 212 lb 9.6 oz (96.4 kg)    Physical Exam  Physical Exam Vitals reviewed.  Constitutional:      Appearance: Normal appearance. Well-developed with normal weight.  Cardiovascular:     Rate and Rhythm: Normal rate and regular rhythm. Normal heart sounds. Normal peripheral pulses Pulmonary:     Normal breath sounds with normal effort - course breath sounds bilateral lower lungs Skin:    General: Skin is warm and dry without noticeable rash. Neurological:     General: No focal deficit present.  Psychiatric:        Mood and Affect: Mood, behavior and cognition normal      Assessment & Plan:   Encounter Diagnoses  Name Primary?   Normocytic anemia Yes   Weight loss    HFrEF (heart failure with reduced ejection fraction) (HCC)    Hypertension, unspecified  type    Obesity (BMI 30-39.9)     No orders of the defined types were placed in this encounter.    Assessment and Plan    Congestive heart failure  - Continue furosemide  20 mg daily.  - Monitor weight for sudden changes. - Advise to report significant dyspnea or edema.  Carotid artery stenosis Significant narrowing noted in 2015. Re-evaluation crucial to prevent complications. - Order carotid ultrasound for re-evaluation.  Essential hypertension Blood pressure well-controlled at 130/80 mmHg. Occasional  hypotensive readings at home. - Monitor blood pressure regularly. - May need to adjust antihypertensive medication if symptomatic hypotension occurs.  Hyperlipidemia Cholesterol levels elevated. Discussed benefits of medication, but he prefers not to start. - Encourage dietary modifications to manage cholesterol levels.  - Will discuss again after carotid US   Prediabetes HbA1c at 6.0. Family history of diabetes. Advised to monitor diet and maintain a healthy lifestyle.  - Discussed maintenance of physical activity as much as possible  Benign paroxysmal positional vertigo (suspected) Dizziness and unsteadiness suggestive of BPPV, possibly exacerbated by sinus problems. - Consider allergy medication to alleviate symptoms.  Anemia - resolved.  Ferritin in normal range I personally reviewed and interpreted the patient labs today.

## 2024-06-15 ENCOUNTER — Telehealth: Payer: Self-pay

## 2024-06-15 NOTE — Patient Instructions (Signed)
 Shamarcus E Rumberger - I am sorry I was unable to reach you today for our scheduled appointment. I work with Zafirov, Clarissa A, MD and am calling to support your healthcare needs. Please contact me at [phone] at your earliest convenience. I look forward to speaking with you soon.   Thank you,  Thersia Hoar, BSW, MHA [place]  [facility] Social Worker, Population Health [phone]

## 2024-06-20 ENCOUNTER — Ambulatory Visit
Admission: RE | Admit: 2024-06-20 | Discharge: 2024-06-20 | Disposition: A | Discharge status: Home / Self Care | Source: Ambulatory Visit

## 2024-06-20 DIAGNOSIS — I6523 Occlusion and stenosis of bilateral carotid arteries: Secondary | ICD-10-CM

## 2024-06-26 ENCOUNTER — Telehealth: Payer: Self-pay

## 2024-06-29 ENCOUNTER — Telehealth: Payer: Self-pay

## 2024-06-29 NOTE — Patient Instructions (Signed)
 David Merritt - I am sorry I was unable to reach you today for our scheduled appointment. I work with Zafirov, Clarissa A, MD and am calling to support your healthcare needs. Please contact me at [phone] at your earliest convenience. I look forward to speaking with you soon.   Thank you,  Thersia Hoar, BSW, MHA [place]  [facility] Social Worker, Population Health [phone]

## 2024-07-03 ENCOUNTER — Telehealth: Payer: Self-pay

## 2024-07-03 NOTE — Patient Outreach (Addendum)
 Third attempt to reach Mr. Sweeden, was able to leave voicemail for return message.  My initial outreach was 10/21 with UTR as of today.  BSW has attempted twice as well.  BSW was able to mail a list of eye and dental resources for [place] on 05/25/24.  Per EPIC notes, he was outreached by the LCSW at his Cardiology office on 06/02/24  to discuss applying for Medicaid to better afford his medications, in which he declined at this time due to the process of having to apply annually.  Will close CCM for now.  Letter sent out to Mr. Reamer, note routed to PCP.

## 2024-07-24 ENCOUNTER — Other Ambulatory Visit: Payer: Self-pay

## 2024-07-25 ENCOUNTER — Other Ambulatory Visit: Payer: Self-pay

## 2024-08-16 ENCOUNTER — Other Ambulatory Visit: Payer: Self-pay

## 2024-08-30 ENCOUNTER — Telehealth: Payer: Self-pay | Admitting: Family

## 2024-08-30 NOTE — Telephone Encounter (Signed)
 Called to confirm/remind patient of their appointment at the [facility] on 08/31/24.   Appointment:   [] Confirmed  [x] Left mess   [] No answer/No voice mail  [] VM Full/unable to leave message  [] Phone not in service  Patient reminded to bring all medications and/or complete list.  Confirmed patient has transportation. Gave directions, instructed to utilize valet parking.

## 2024-08-30 NOTE — Progress Notes (Unsigned)
 "  [facility] Note    Referring Physician:08/25 admission PCP: Zafirov, Clarissa A, MD Cardiologist: None   Chief Complaint: shortness of breath   HPI:  David Merritt is a 84 y/o male with a history of hyperlipidemia, HTN, IDA, HFrEF (08/25), alcohol use, pleural effusion, preDM, GERD, depression, former smoker, colon cancer s/p partial colectomy w/ chemotherapy.   Admitted 04/18/24 with cough and SOB along with chest pressure. Oxygen desaturation to 88% on room air, which improved to 94% on 6 L oxygen. Infiltrates seen on CT though no fever or leukocytosis or elevated procal, respiratory testing negative. Patient was treated with antibiotics. CT showed pleural effusion, thoracentesis ordered but they were too small to tap. BNP elevated. Echo 04/19/24: EF 35-40%, normal RV, no valvular disease. IV diuresed. Antibiotics given for possible pneumonia. IV iron  given prior to discharge and then an infusion as an outpatient. CTA was negative for PE. A1c done due to elevated glucose. Hyponatremia resolved with diuresis.   He presents today for a HF follow-up visit with a chief complaint of shortness of breath. Has associated fatigue, occasional palpitations, dizziness, neck pain/ stiffness. Sleeping ok on 1 pillow. Using a walker.   No tobacco. Was drinking 2.5 ounces of bourbon every morning at breakfast (previously drinking 1 pint daily). Has been drinking alcohol for all my life. Now has had 1 beer and 2 drinks since his 08/25 admission.  Has been out of farxiga  for 3 weeks due to cost. Says that he felt a little better when he was taking it.   ROS: All systems negative except what is listed in HPI, PMH and Problem List  Past Medical History:  Diagnosis Date   Acute respiratory failure with hypoxia (HCC) 04/18/2024   Allergy    Colon cancer (HCC)    Hypertension    Myocardial injury 04/18/2024   Obesity (BMI 30-39.9)     Current Outpatient Medications  Medication Sig  Dispense Refill   carvedilol  (COREG ) 3.125 MG tablet Take 1 tablet (3.125 mg total) by mouth 2 (two) times daily. 60 tablet 1   furosemide  (LASIX ) 20 MG tablet Take 1 tablet (20 mg total) by mouth daily. 30 tablet 1   valsartan  (DIOVAN ) 40 MG tablet Take 1 tablet (40 mg total) by mouth daily. 90 tablet 3   No current facility-administered medications for this visit.    Allergies  Allergen Reactions   Iodinated Contrast Media Other (See Comments)    Pt unsure if this is correct      Social History   Socioeconomic History   Marital status: Divorced    Spouse name: Not on file   Number of children: 2   Years of education: Not on file   Highest education level: GED or equivalent  Occupational History   Not on file  Tobacco Use   Smoking status: Former    Types: Cigarettes, Pipe   Smokeless tobacco: Never  Vaping Use   Vaping status: Never Used  Substance and Sexual Activity   Alcohol use: Yes    Alcohol/week: 14.0 standard drinks of alcohol    Types: 14 Shots of liquor per week   Drug use: Never   Sexual activity: Not on file  Other Topics Concern   Not on file  Social History Narrative   Not on file   Social Drivers of Health   Tobacco Use: Medium Risk (06/01/2024)   Patient History    Smoking Tobacco Use: Former    Smokeless Tobacco Use:  Never    Passive Exposure: Not on file  Financial Resource Strain: Low Risk (05/25/2024)   Overall Financial Resource Strain (CARDIA)    Difficulty of Paying Living Expenses: Not very hard  Food Insecurity: No Food Insecurity (05/25/2024)   Epic    Worried About Programme Researcher, Broadcasting/film/video in the Last Year: Never true    Ran Out of Food in the Last Year: Never true  Transportation Needs: No Transportation Needs (05/25/2024)   Epic    Lack of Transportation (Medical): No    Lack of Transportation (Non-Medical): No  Physical Activity: Not on file  Stress: Not on file  Social Connections: Socially Isolated (04/19/2024)   Social Connection  and Isolation Panel    Frequency of Communication with Friends and Family: Once a week    Frequency of Social Gatherings with Friends and Family: Once a week    Attends Religious Services: Never    Database Administrator or Organizations: No    Attends Banker Meetings: Never    Marital Status: Divorced  Catering Manager Violence: Not At Risk (04/19/2024)   Epic    Fear of Current or Ex-Partner: No    Emotionally Abused: No    Physically Abused: No    Sexually Abused: No  Depression (PHQ2-9): Low Risk (06/13/2024)   Depression (PHQ2-9)    PHQ-2 Score: 1  Recent Concern: Depression (PHQ2-9) - High Risk (05/22/2024)   Depression (PHQ2-9)    PHQ-2 Score: 11  Alcohol Screen: Not on file  Housing: Unknown (05/25/2024)   Epic    Unable to Pay for Housing in the Last Year: No    Number of Times Moved in the Last Year: Not on file    Homeless in the Last Year: No  Utilities: Not At Risk (05/25/2024)   Epic    Threatened with loss of utilities: No  Health Literacy: Not on file      Family History  Problem Relation Age of Onset   Clotting disorder Mother    Heart attack Father    CAD Sister    Heart disease Sister    Drug abuse Daughter    There were no vitals filed for this visit.  Wt Readings from Last 3 Encounters:  06/14/24 211 lb 3.2 oz (95.8 kg)  06/01/24 213 lb 6.4 oz (96.8 kg)  05/22/24 212 lb 9.6 oz (96.4 kg)   Lab Results  Component Value Date   CREATININE 1.03 05/22/2024   CREATININE 1.09 04/26/2024   CREATININE 0.99 04/20/2024    PHYSICAL EXAM:  General: Well appearing.  Cor: No JVD. Regular rhythm, rate.  Lungs: clear Abdomen: soft, nontender, nondistended. Extremities: no edema Neuro:. Affect pleasant    ECG: not done   ASSESSMENT & PLAN:  1: HFrEF- newly diagnosed 08/25 - unclear etiology, suspect long-term alcohol use - NYHA class II - euvolemic - weight down 8 pounds from last visit here 1 month ago - Echo 04/19/24: EF 35-40%,  normal RV, no valvular disease. - continue carvedilol  3.125mg  BID - continue furosemide  20mg  daily - continue valsartan  40mg  daily.  - has been out of farxiga  for ~ 3 weeks. Will begin jardiance  10mg  daily (voucher provided) while pharm looks into patient assistance. He says that if it's too expensive and he doesn't qualify for assistance, he won't be able to take it - current BP and dizziness will not allow for MRA - not adding salt but does eat frozen meals/ soups. Is aware of high sodium  content of those foods and tries to make least sodium choices as he can - BNP 04/18/24 was 592.3  2: HTN- - BP 105/74 - saw PCP Cora) 09/25 - BMET 05/22/24 reviewed: sodium 135, potassium 4.5, creatinine 1.03 & GFR 72  3: HLD- - LDL 05/22/24 was 142 - he is not interested in starting med at this time  4: IDA- - received iron  infusion during recent admission and once since discharge - ferritin low at 13 on 04/18/24 - Hg 05/22/24 was 13.7   5: Alcohol use- - has had 1 beer and 2 drinks since discharge - was drinking 2.5 ounces of bourbon every morning with breakfast. Has been drinking his whole life - previously used to drink a pint a day - reviewed the effects of alcohol use with the patient and encouraged him to work towards cessation.    Return in 3 months, sooner if needed.   I spent XX minutes reviewing records, interviewing/ examing patient and managing plan/ orders.    Ellouise DELENA Class, FNP 08/30/2024 "

## 2024-08-31 ENCOUNTER — Telehealth: Payer: Self-pay | Admitting: Family

## 2024-08-31 ENCOUNTER — Encounter: Admitting: Family

## 2024-08-31 NOTE — Telephone Encounter (Signed)
 Patient did not show for his [facility] Failure [facility] appointment on 08/31/24.

## 2024-09-26 ENCOUNTER — Telehealth: Payer: Self-pay | Admitting: Family

## 2024-10-30 ENCOUNTER — Ambulatory Visit
